# Patient Record
Sex: Female | Born: 1970 | Race: White | Hispanic: No | Marital: Married | Smoking: Current every day smoker
Health system: Southern US, Community
[De-identification: ages and names within clinical notes are randomized; demographics above are authoritative.]

## PROBLEM LIST (undated history)

## (undated) DIAGNOSIS — G709 Myoneural disorder, unspecified: Secondary | ICD-10-CM

## (undated) DIAGNOSIS — C801 Malignant (primary) neoplasm, unspecified: Secondary | ICD-10-CM

## (undated) DIAGNOSIS — S0300XA Dislocation of jaw, unspecified side, initial encounter: Secondary | ICD-10-CM

## (undated) DIAGNOSIS — T7840XA Allergy, unspecified, initial encounter: Secondary | ICD-10-CM

## (undated) DIAGNOSIS — F419 Anxiety disorder, unspecified: Secondary | ICD-10-CM

## (undated) HISTORY — DX: Dislocation of jaw, unspecified side, initial encounter: S03.00XA

## (undated) HISTORY — DX: Malignant (primary) neoplasm, unspecified: C80.1

## (undated) HISTORY — DX: Anxiety disorder, unspecified: F41.9

## (undated) HISTORY — DX: Myoneural disorder, unspecified: G70.9

## (undated) HISTORY — DX: Allergy, unspecified, initial encounter: T78.40XA

---

## 1997-08-23 ENCOUNTER — Other Ambulatory Visit: Admission: RE | Admit: 1997-08-23 | Discharge: 1997-08-23 | Payer: Self-pay | Admitting: Family Medicine

## 2001-09-06 ENCOUNTER — Other Ambulatory Visit: Admission: RE | Admit: 2001-09-06 | Discharge: 2001-09-06 | Payer: Self-pay | Admitting: Obstetrics and Gynecology

## 2019-11-13 ENCOUNTER — Other Ambulatory Visit: Payer: Self-pay

## 2019-11-13 DIAGNOSIS — Z20822 Contact with and (suspected) exposure to covid-19: Secondary | ICD-10-CM

## 2019-11-15 LAB — NOVEL CORONAVIRUS, NAA: SARS-CoV-2, NAA: NOT DETECTED

## 2019-11-15 LAB — SARS-COV-2, NAA 2 DAY TAT

## 2020-04-08 ENCOUNTER — Other Ambulatory Visit: Payer: Self-pay | Admitting: Ophthalmology

## 2020-04-09 ENCOUNTER — Other Ambulatory Visit: Payer: Self-pay | Admitting: Ophthalmology

## 2020-04-09 DIAGNOSIS — G245 Blepharospasm: Secondary | ICD-10-CM

## 2020-05-01 ENCOUNTER — Other Ambulatory Visit: Payer: Self-pay

## 2020-05-01 ENCOUNTER — Ambulatory Visit
Admission: RE | Admit: 2020-05-01 | Discharge: 2020-05-01 | Disposition: A | Discharge status: Home / Self Care | Payer: BC Managed Care – PPO | Source: Ambulatory Visit | Attending: Ophthalmology | Admitting: Ophthalmology

## 2020-05-01 DIAGNOSIS — G245 Blepharospasm: Secondary | ICD-10-CM

## 2020-05-01 IMAGING — MR MR FACE/TRIGEMINAL WO/W CM
7 of 8 series · 34 of 48 positions shown · IV contrast (multihance)
Comparison: Head CT [DATE].

CLINICAL DATA: Blepharospasm.

EXAM:
MRI FACE TRIGEMINAL WITHOUT AND WITH CONTRAST
MRI BRAIN WITHOUT AND WITH CONTRAST
TECHNIQUE: Multiplanar, multiecho pulse sequences of the face and surrounding
structures, including thin slice imaging of the course of the
Trigeminal Nerves, were obtained both before and after
administration of intravenous contrast.
Multiplanar, multiecho pulse sequences of the brain and surrounding
structures were obtained without and with intravenous contrast.
CONTRAST:  18mL MULTIHANCE GADOBENATE DIMEGLUMINE 529 MG/ML IV SOLN

[Series 1: T1 · sagittal · 4.0mm · 0.70mm/px · 3 of 30 slices shown (1 of 3)]
[im 1/30]
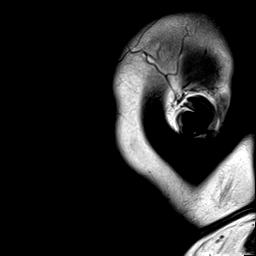
[im 15/30]
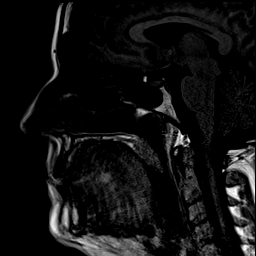
[im 30/30]
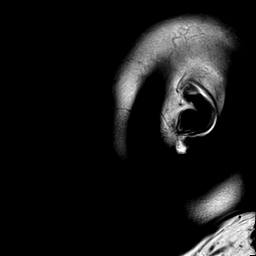

[Series 2: T2 · coronal · 3.0mm · 0.70mm/px · 5 of 43 slices shown]
[im 1/43]
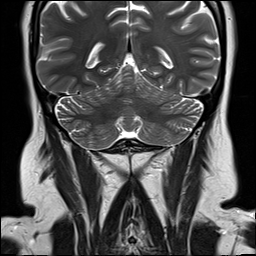
[im 11/43]
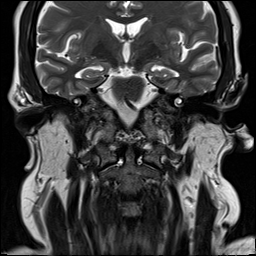
[im 22/43]
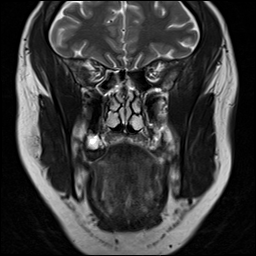
[im 32/43]
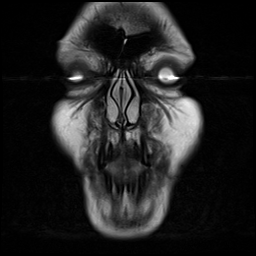
[im 43/43]
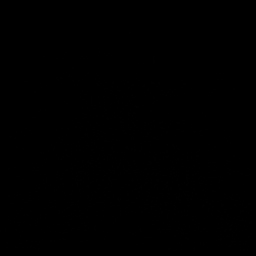

[Series 3: STIR · axial · 3.0mm · 0.66mm/px · z∈[-196,-63]mm · 4 of 35 slices shown]
[im 1/35]
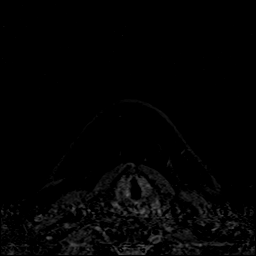
[im 12/35]
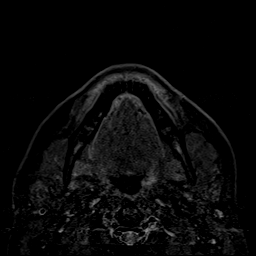
[im 23/35]
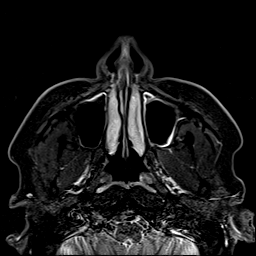
[im 35/35]
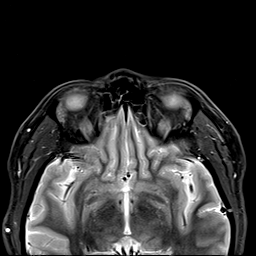

[Series 5: T1 · axial · 3.0mm · 0.33mm/px · z∈[-203,-57]mm · 5 of 45 slices shown (2 of 3)]
[im 1/45]
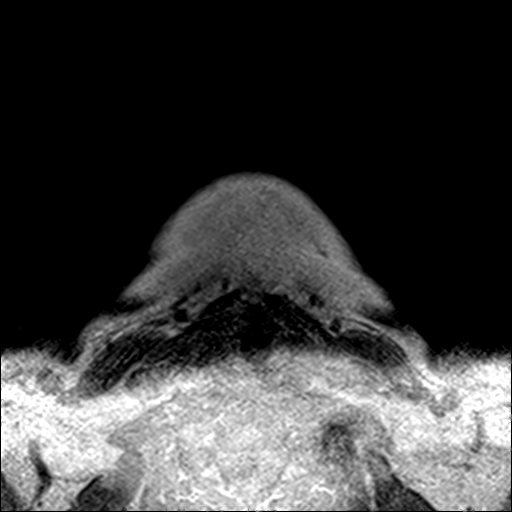
[im 12/45]
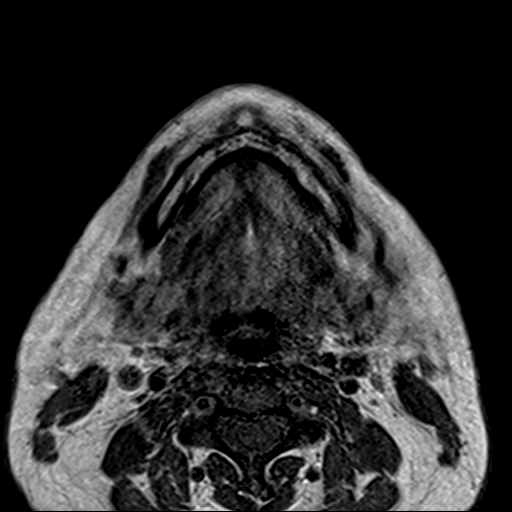
[im 23/45]
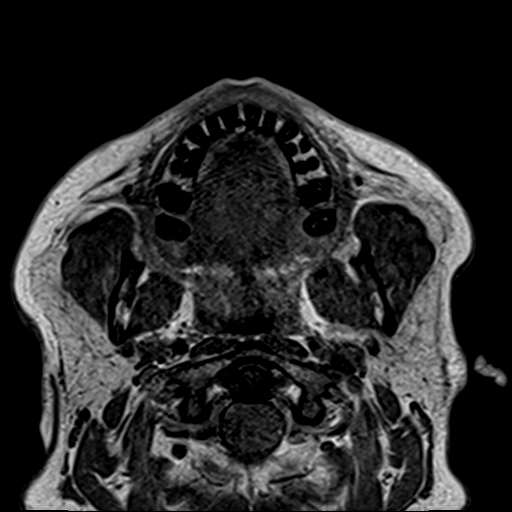
[im 34/45]
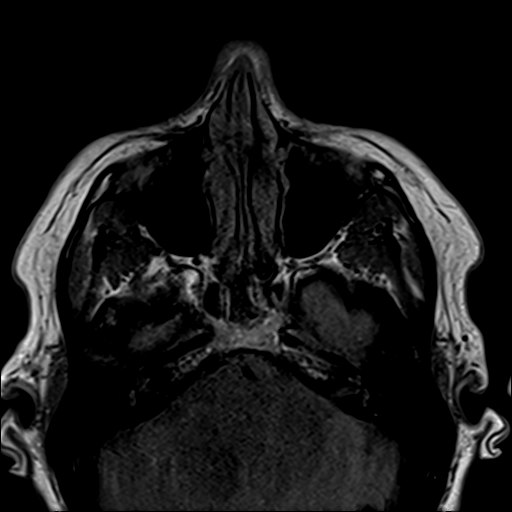
[im 45/45]
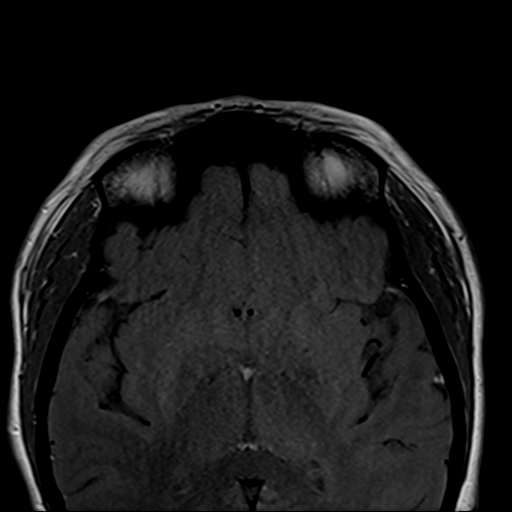

[Series 6: T1 · coronal · 3.0mm · 0.70mm/px · 6 of 50 slices shown (3 of 3)]
[im 1/50]
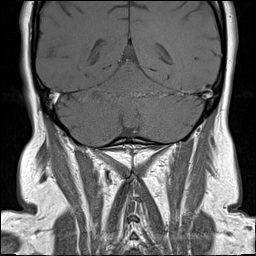
[im 10/50]
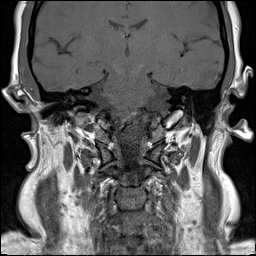
[im 20/50]
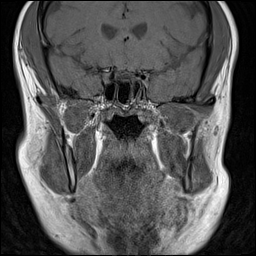
[im 30/50]
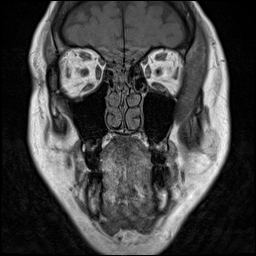
[im 40/50]
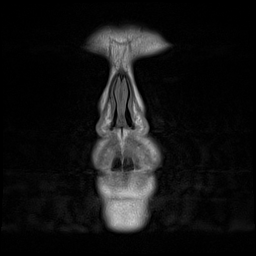
[im 50/50]
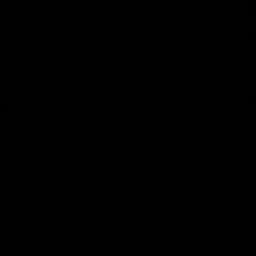

[Series 7: T1 post-contrast · coronal · 3.0mm · 0.70mm/px · 6 of 50 slices shown]
[im 1/50]
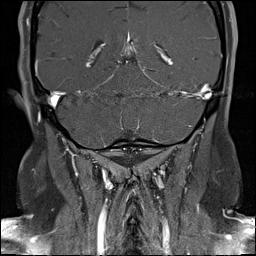
[im 10/50]
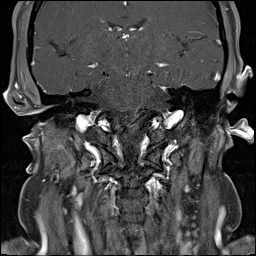
[im 20/50]
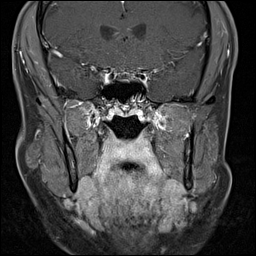
[im 30/50]
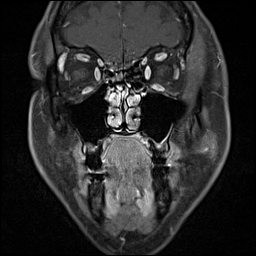
[im 40/50]
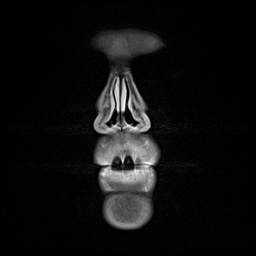
[im 50/50]
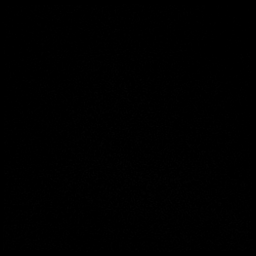

[Series 8: T1 fat-sat post-contrast · axial · 3.0mm · 0.33mm/px · z∈[-203,-57]mm · 5 of 45 slices shown]
[im 1/45]
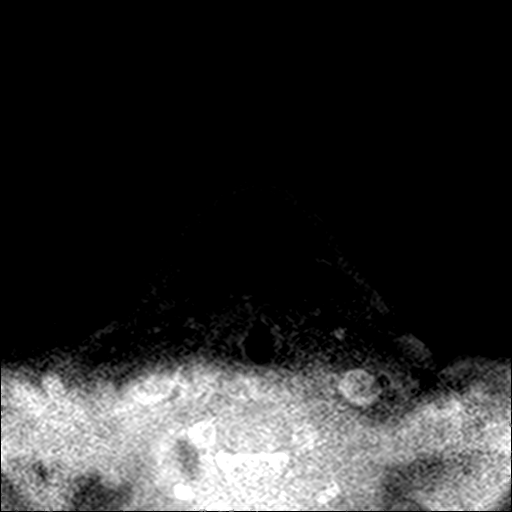
[im 12/45]
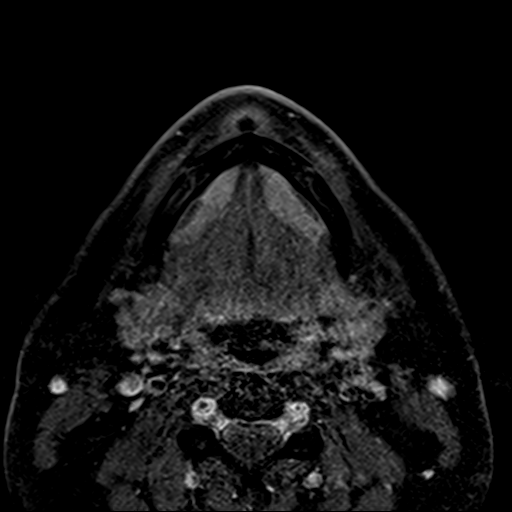
[im 23/45]
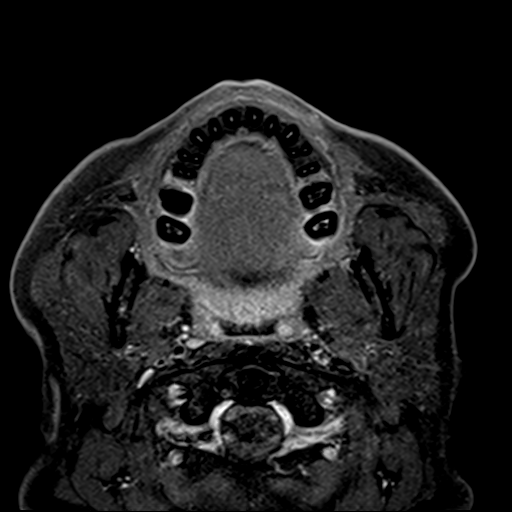
[im 34/45]
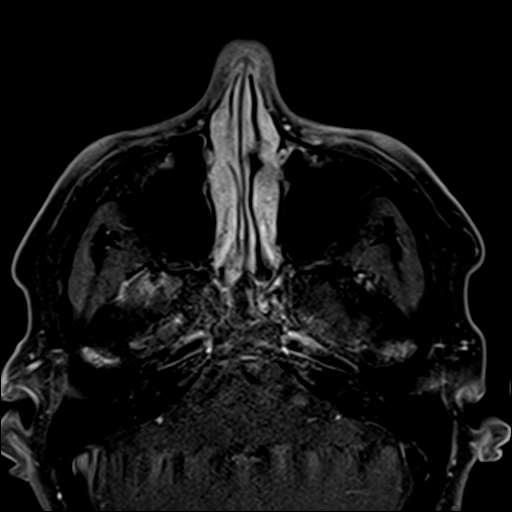
[im 45/45]
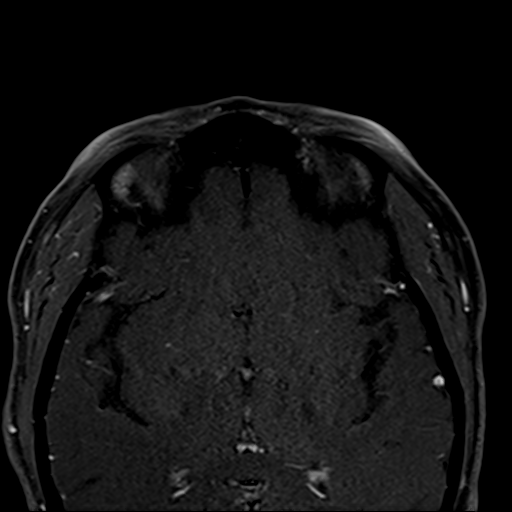

[34 of 48 positions shown; findings below may reference images not displayed]

FINDINGS: The study is partially degraded by motion.

Brain: No acute infarction, hemorrhage, hydrocephalus, extra-axial
collection or mass lesion. The brain parenchyma has normal
morphology and signal characteristics without evidence of abnormal
contrast enhancement.

Vascular: Normal flow voids.

Skull and upper cervical spine: Normal marrow signal.

Sinuses/Orbits: Mild mucosal thickening throughout the paranasal
sinuses. The orbits are maintained.

TRIGEMINAL NERVE

Vascular Loop: A vascular loop abuts the cisternal segment of the
right trigeminal nerve. No vascular loop on the left. No impingement
on the root entry zone bilaterally.

Perineural Enhancement: No abnormal perineural enhancement.

Cavernous Sinus: Normal.

Mandible: Normal.

Muscles of Mastication: Symmetric without denervation atrophy.
IMPRESSION: 1. A vascular loop abuts the cisternal segment of the right
trigeminal nerve. No impingement on the root entry zone bilaterally.
2. Otherwise, unremarkable MRI of the brain face.
3. Mild paranasal sinus disease.

## 2020-05-01 IMAGING — MR MR HEAD WO/W CM
13 series · 48 of 48 positions shown · IV contrast (multihance)
Comparison: Head CT [DATE].

CLINICAL DATA: Blepharospasm.

EXAM:
MRI FACE TRIGEMINAL WITHOUT AND WITH CONTRAST
MRI BRAIN WITHOUT AND WITH CONTRAST
TECHNIQUE: Multiplanar, multiecho pulse sequences of the face and surrounding
structures, including thin slice imaging of the course of the
Trigeminal Nerves, were obtained both before and after
administration of intravenous contrast.
Multiplanar, multiecho pulse sequences of the brain and surrounding
structures were obtained without and with intravenous contrast.
CONTRAST:  18mL MULTIHANCE GADOBENATE DIMEGLUMINE 529 MG/ML IV SOLN

[Series 5: T1 · sagittal · 4.0mm · 0.75mm/px · 2 of 31 slices shown (1 of 3)]
[im 1/31]
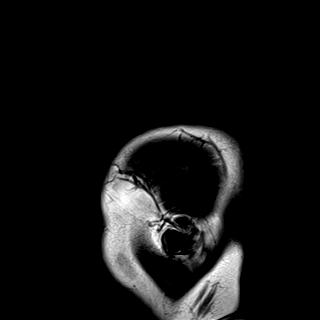
[im 31/31]
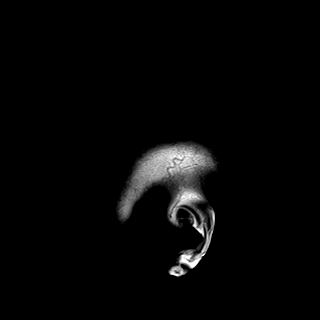

[Series 6: DWI · axial · 3.0mm · 0.94mm/px · z∈[-115,+31]mm · 8 of 168 slices shown (1 of 3)]
[im 1/168]
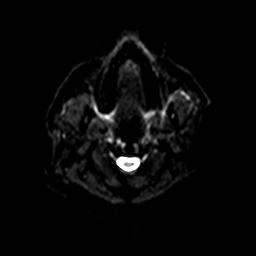
[im 24/168]
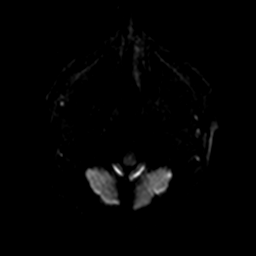
[im 48/168]
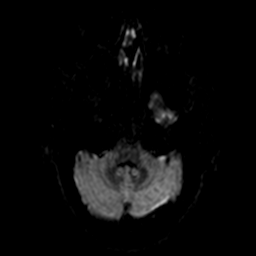
[im 72/168]
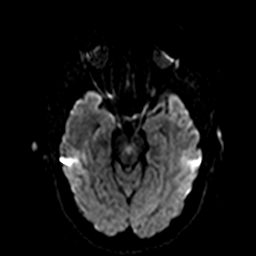
[im 96/168]
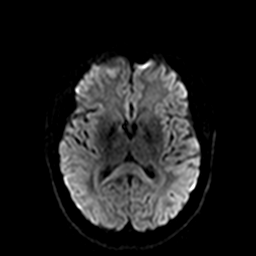
[im 120/168]
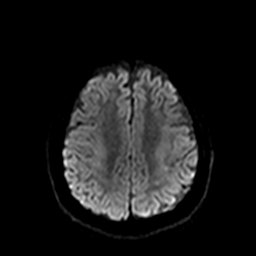
[im 144/168]
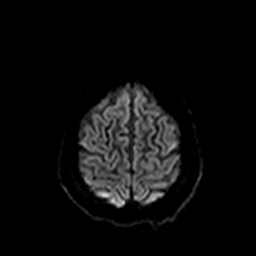
[im 168/168]
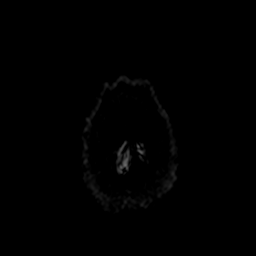

[Series 7: ax dwi_tracew · axial · 3.0mm · 0.94mm/px · z∈[-115,+31]mm · 4 of 84 slices shown]
[im 1/84]
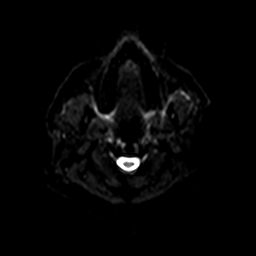
[im 28/84]
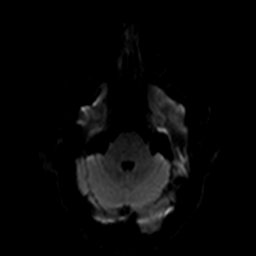
[im 56/84]
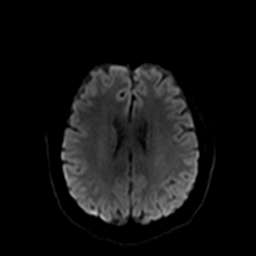
[im 84/84]
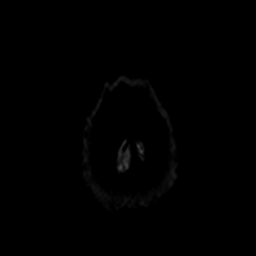

[Series 8: ax dwi_adc · axial · 3.0mm · 0.94mm/px · z∈[-115,+31]mm · 2 of 42 slices shown]
[im 1/42]
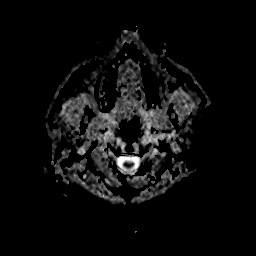
[im 42/42]
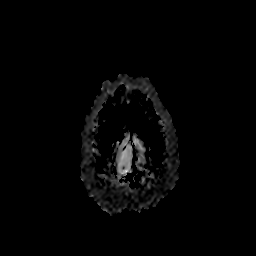

[Series 9: DWI · coronal · 5.0mm · 1.44mm/px · 3 of 60 slices shown (2 of 3)]
[im 1/60]
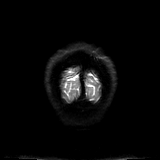
[im 30/60]
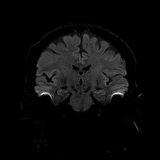
[im 60/60]
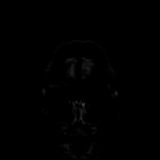

[Series 10: DWI · coronal · 5.0mm · 1.44mm/px · 2 of 30 slices shown (3 of 3)]
[im 1/30]
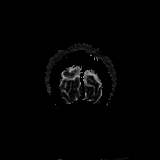
[im 30/30]
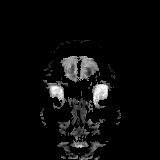

[Series 11: T2 · axial · 4.0mm · 0.36mm/px · 1 of 27 slices shown]
[im 1/27]
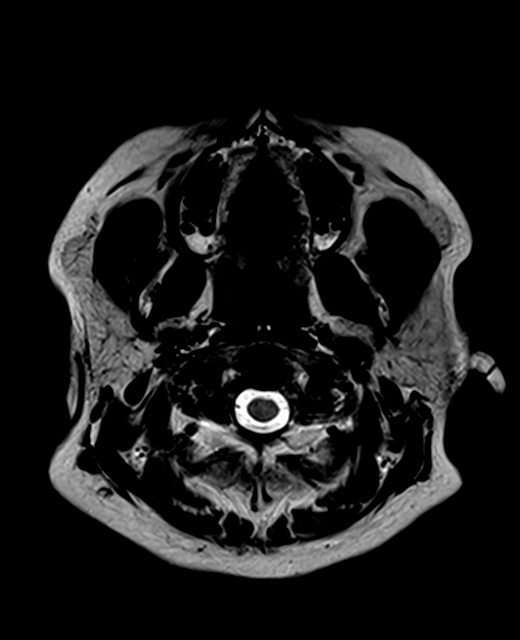

[Series 12: FLAIR · axial · 3.0mm · 0.72mm/px · 1 of 26 slices shown]
[im 1/26]
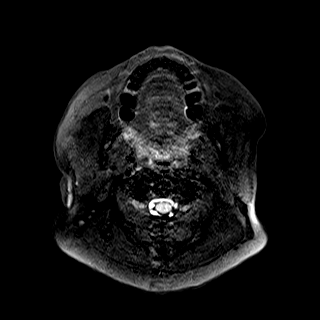

[Series 14: swi_images · axial · 1.5mm · 0.90mm/px · z∈[-113,+28]mm · 5 of 96 slices shown]
[im 1/96]
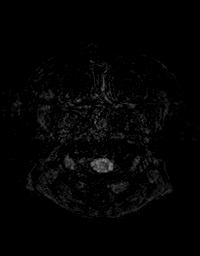
[im 24/96]
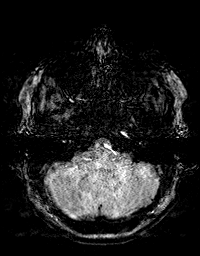
[im 48/96]
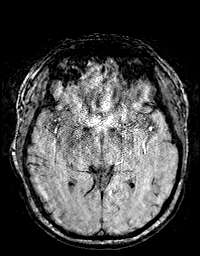
[im 72/96]
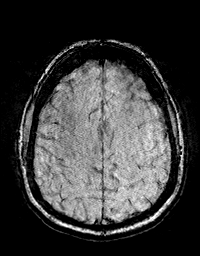
[im 96/96]
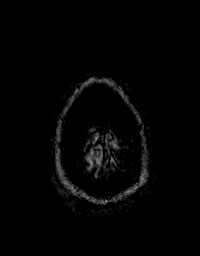

[Series 15: T1 · axial · 1.0mm · 0.94mm/px · z∈[-120,+37]mm · 8 of 160 slices shown (2 of 3)]
[im 1/160]
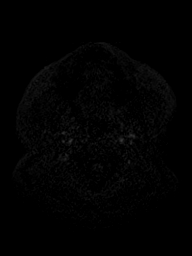
[im 23/160]
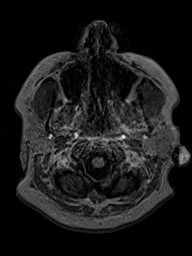
[im 46/160]
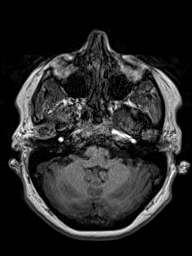
[im 69/160]
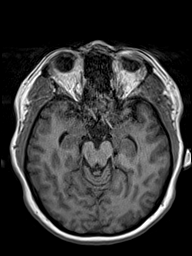
[im 91/160]
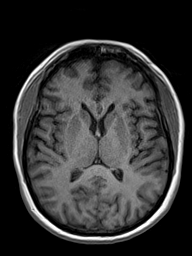
[im 114/160]
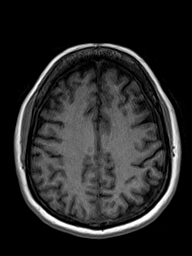
[im 137/160]
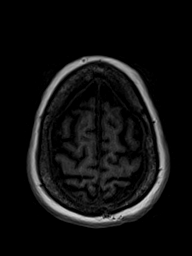
[im 160/160]
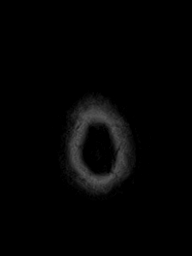

[Series 16: T2 post-contrast · coronal · 4.0mm · 0.36mm/px · 2 of 34 slices shown]
[im 1/34]
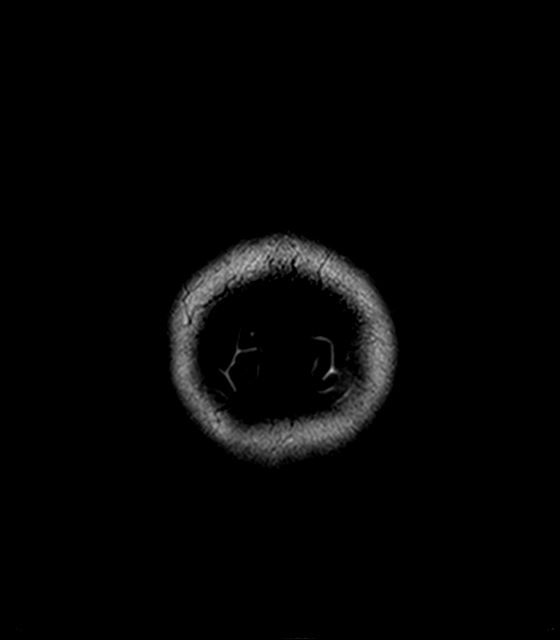
[im 34/34]
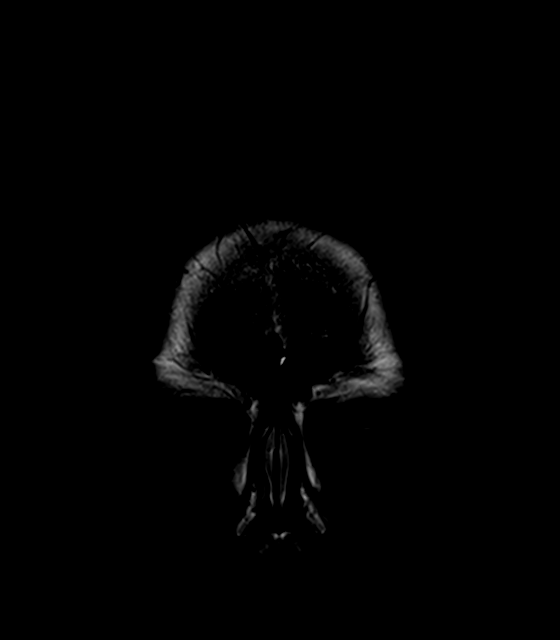

[Series 17: T1 · axial · 1.0mm · 0.94mm/px · z∈[-120,+37]mm · 8 of 160 slices shown (3 of 3)]
[im 1/160]
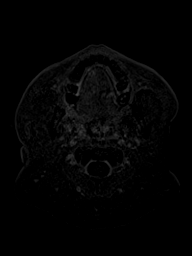
[im 23/160]
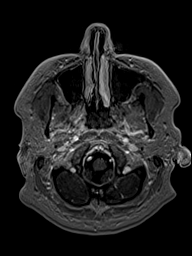
[im 46/160]
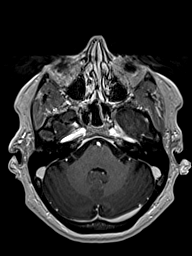
[im 69/160]
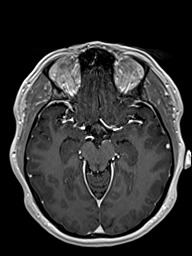
[im 91/160]
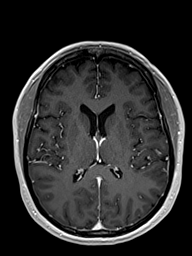
[im 114/160]
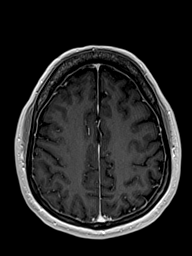
[im 137/160]
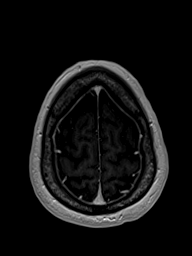
[im 160/160]
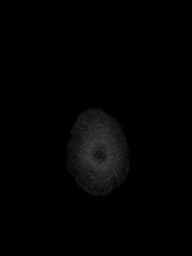

[Series 18: T1 post-contrast · coronal · 4.0mm · 0.72mm/px · 2 of 34 slices shown]
[im 1/34]
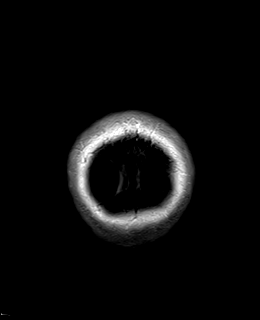
[im 34/34]
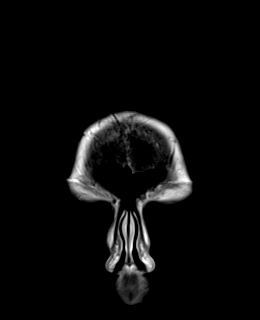

[48 of 48 positions shown; findings below may reference images not displayed]

FINDINGS: The study is partially degraded by motion.

Brain: No acute infarction, hemorrhage, hydrocephalus, extra-axial
collection or mass lesion. The brain parenchyma has normal
morphology and signal characteristics without evidence of abnormal
contrast enhancement.

Vascular: Normal flow voids.

Skull and upper cervical spine: Normal marrow signal.

Sinuses/Orbits: Mild mucosal thickening throughout the paranasal
sinuses. The orbits are maintained.

TRIGEMINAL NERVE

Vascular Loop: A vascular loop abuts the cisternal segment of the
right trigeminal nerve. No vascular loop on the left. No impingement
on the root entry zone bilaterally.

Perineural Enhancement: No abnormal perineural enhancement.

Cavernous Sinus: Normal.

Mandible: Normal.

Muscles of Mastication: Symmetric without denervation atrophy.
IMPRESSION: 1. A vascular loop abuts the cisternal segment of the right
trigeminal nerve. No impingement on the root entry zone bilaterally.
2. Otherwise, unremarkable MRI of the brain face.
3. Mild paranasal sinus disease.

## 2020-05-01 MED ORDER — GADOBENATE DIMEGLUMINE 529 MG/ML IV SOLN
18.0000 mL | Freq: Once | INTRAVENOUS | Status: AC | PRN
  Administered 2020-05-01: 18 mL via INTRAVENOUS

## 2020-11-25 ENCOUNTER — Encounter: Payer: Self-pay | Admitting: Gastroenterology

## 2020-12-08 ENCOUNTER — Other Ambulatory Visit: Payer: Self-pay

## 2020-12-08 ENCOUNTER — Ambulatory Visit (AMBULATORY_SURGERY_CENTER): Payer: BC Managed Care – PPO | Admitting: *Deleted

## 2020-12-08 VITALS — Ht 64.0 in | Wt 194.0 lb

## 2020-12-08 DIAGNOSIS — Z8371 Family history of colonic polyps: Secondary | ICD-10-CM

## 2020-12-08 DIAGNOSIS — Z1211 Encounter for screening for malignant neoplasm of colon: Secondary | ICD-10-CM

## 2020-12-08 DIAGNOSIS — Z83719 Family history of colon polyps, unspecified: Secondary | ICD-10-CM

## 2020-12-08 NOTE — Progress Notes (Signed)
Pt verified name, DOB, address and insurance during PV today.  Pt mailed instruction packet of Emmi video, copy of consent form to read and not return, and instructions. . PV completed over the phone.  Pt encouraged to call with questions or issues.  My Chart instructions to pt as well    No egg or soy allergy known to patient  No issues known to pt with past sedation with any surgeries or procedures Patient denies ever being told they had issues or difficulty with intubation  No FH of Malignant Hyperthermia Pt is not on diet pills Pt is not on  home 02  Pt is not on blood thinners  Pt denies issues with constipation  No A fib or A flutter  Pt is fully vaccinated  for Covid    Due to the COVID-19 pandemic we are asking patients to follow certain guidelines in PV and the [place]   Pt aware of COVID protocols and [facility] guidelines

## 2020-12-11 ENCOUNTER — Encounter: Payer: Self-pay | Admitting: Gastroenterology

## 2021-01-01 ENCOUNTER — Ambulatory Visit (AMBULATORY_SURGERY_CENTER): Payer: BC Managed Care – PPO | Admitting: Gastroenterology

## 2021-01-01 ENCOUNTER — Encounter: Payer: Self-pay | Admitting: Gastroenterology

## 2021-01-01 VITALS — BP 138/81 | HR 65 | Temp 98.7°F | Resp 13 | Ht 64.0 in | Wt 196.0 lb

## 2021-01-01 DIAGNOSIS — D125 Benign neoplasm of sigmoid colon: Secondary | ICD-10-CM

## 2021-01-01 DIAGNOSIS — Z1211 Encounter for screening for malignant neoplasm of colon: Secondary | ICD-10-CM

## 2021-01-01 MED ORDER — SODIUM CHLORIDE 0.9 % IV SOLN: 500.0000 mL | Freq: Once | INTRAVENOUS | Status: AC

## 2021-01-01 NOTE — Progress Notes (Signed)
[facility] History and Physical   Primary Care Physician:  Zoila Shutter, NP   Reason for Procedure:   Family history of colon polyps (mom)  Plan:     colonoscopy     HPI: Margaret Woodward is a 50 y.o. female    Past Medical History:  Diagnosis Date   Allergy    Anxiety    Cancer ([place])    skin cancer   Neuromuscular disorder ([place])    Raynaud's dx   TMJ (dislocation of temporomandibular joint)     History reviewed. No pertinent surgical history.  Prior to Admission medications   Medication Sig Start Date End Date Taking? Authorizing Provider  Ascorbic Acid (VITAMIN C) 1000 MG tablet Take 1,000 mg by mouth daily.   Yes [provider]  B Complex-C (SUPER B COMPLEX PO) Take by mouth.   Yes [provider]  Baclofen 5 MG TABS Take 1 tablet by mouth daily as needed. 09/30/20  Yes [provider]  clonazePAM (KLONOPIN) 0.5 MG tablet Take 0.5 mg by mouth 2 (two) times daily. 09/30/20  Yes [provider]  nicotine polacrilex (NICORETTE) 4 MG gum Take by mouth.   Yes [provider]  Omega-3 Fatty Acids (OMEGA 3 PO) Take by mouth.   Yes [provider]  VITAMIN D PO Take by mouth.   Yes [provider]  VITAMIN E PO Take by mouth.   Yes [provider]  albuterol (VENTOLIN HFA) 108 (90 Base) MCG/ACT inhaler SMARTSIG:1 Puff(s) Via Inhaler Every 4 Hours PRN 08/04/20   [provider]  BOTOX 100 units SOLR injection  12/01/20   [provider]  cyanocobalamin (,VITAMIN B-12,) 1000 MCG/ML injection Inject into the muscle. 08/04/20   [provider]  mupirocin ointment (BACTROBAN) 2 % Apply topically. 06/16/20   [provider]    Current Outpatient Medications  Medication Sig Dispense Refill   Ascorbic Acid (VITAMIN C) 1000 MG tablet Take 1,000 mg by mouth daily.     B Complex-C (SUPER B COMPLEX PO) Take by mouth.     Baclofen 5 MG TABS Take 1 tablet by mouth daily as  needed.     clonazePAM (KLONOPIN) 0.5 MG tablet Take 0.5 mg by mouth 2 (two) times daily.     nicotine polacrilex (NICORETTE) 4 MG gum Take by mouth.     Omega-3 Fatty Acids (OMEGA 3 PO) Take by mouth.     VITAMIN D PO Take by mouth.     VITAMIN E PO Take by mouth.     albuterol (VENTOLIN HFA) 108 (90 Base) MCG/ACT inhaler SMARTSIG:1 Puff(s) Via Inhaler Every 4 Hours PRN     BOTOX 100 units SOLR injection      cyanocobalamin (,VITAMIN B-12,) 1000 MCG/ML injection Inject into the muscle.     mupirocin ointment (BACTROBAN) 2 % Apply topically.     Current Facility-Administered Medications  Medication Dose Route Frequency Provider Last Rate Last Admin   0.9 %  sodium chloride infusion  500 mL Intravenous Once Jackquline Denmark, MD        Allergies as of 01/01/2021 - Review Complete 01/01/2021  Allergen Reaction Noted   Doxycycline Other (See Comments) and Shortness Of Breath 05/04/2015   Amoxicillin-pot clavulanate Nausea And Vomiting 08/04/2017   Phentermine-topiramate Other (See Comments) 05/07/2019    Family History  Problem Relation Age of Onset   Colon polyps Mother    Colon cancer Neg Hx    Esophageal cancer Neg Hx  Rectal cancer Neg Hx    Stomach cancer Neg Hx     Social History   Socioeconomic History   Marital status: Married    Spouse name: Not on file   Number of children: Not on file   Years of education: Not on file   Highest education level: Not on file  Occupational History   Not on file  Tobacco Use   Smoking status: Every Day    Packs/day: 0.50    Types: Cigarettes   Smokeless tobacco: Never   Tobacco comments:    0.5-1.0 a day   Vaping Use   Vaping Use: Never used  Substance and Sexual Activity   Alcohol use: Yes    Comment: OCC   Drug use: Never   Sexual activity: Not on file  Other Topics Concern   Not on file  Social History Narrative   Not on file   Social Determinants of Health   Financial Resource Strain: Not on file  Food Insecurity:  Not on file  Transportation Needs: Not on file  Physical Activity: Not on file  Stress: Not on file  Social Connections: Not on file  Intimate Partner Violence: Not on file    Review of Systems: Positive for none All other review of systems negative except as mentioned in the HPI.  Physical Exam: Vital signs in last 24 hours: @VSRANGES @   General:   Alert,  Well-developed, well-nourished, pleasant and cooperative in NAD Lungs:  Clear throughout to auscultation.   Heart:  Regular rate and rhythm; no murmurs, clicks, rubs,  or gallops. Abdomen:  Soft, nontender and nondistended. Normal bowel sounds.   Neuro/Psych:  Alert and cooperative. Normal mood and affect. A and O x 3    No significant changes were identified.  The patient continues to be an appropriate candidate for the planned procedure and anesthesia.   Carmell Austria, MD. [facility] 01/01/2021 9:29 AM@

## 2021-01-01 NOTE — Patient Instructions (Signed)
YOU HAD AN ENDOSCOPIC PROCEDURE TODAY AT THE [facility]:   Refer to the procedure report that was given to you for any specific questions about what was found during the examination.  If the procedure report does not answer your questions, please call your gastroenterologist to clarify.  If you requested that your care partner not be given the details of your procedure findings, then the procedure report has been included in a sealed envelope for you to review at your convenience later.  YOU SHOULD EXPECT: Some feelings of bloating in the abdomen. Passage of more gas than usual.  Walking can help get rid of the air that was put into your GI tract during the procedure and reduce the bloating. If you had a lower endoscopy (such as a colonoscopy or flexible sigmoidoscopy) you may notice spotting of blood in your stool or on the toilet paper. If you underwent a bowel prep for your procedure, you may not have a normal bowel movement for a few days.  Please Note:  You might notice some irritation and congestion in your nose or some drainage.  This is from the oxygen used during your procedure.  There is no need for concern and it should clear up in a day or so.  SYMPTOMS TO REPORT IMMEDIATELY:  Following lower endoscopy (colonoscopy or flexible sigmoidoscopy):  Excessive amounts of blood in the stool  Significant tenderness or worsening of abdominal pains  Swelling of the abdomen that is new, acute  Fever of 100F or higher   For urgent or emergent issues, a gastroenterologist can be reached at any hour by calling [phone]. Do not use MyChart messaging for urgent concerns.    DIET:  We do recommend a small meal at first, but then you may proceed to your regular diet.  Drink plenty of fluids but you should avoid alcoholic beverages for 24 hours.  MEDICATIONS:  Continue present medications.  Please see handouts given to you by your recovery nurse.  Thank you for allowing us to  provide for your healthcare needs today.  ACTIVITY:  You should plan to take it easy for the rest of today and you should NOT DRIVE or use heavy machinery until tomorrow (because of the sedation medicines used during the test).    FOLLOW UP: Our staff will call the number listed on your records 48-72 hours following your procedure to check on you and address any questions or concerns that you may have regarding the information given to you following your procedure. If we do not reach you, we will leave a message.  We will attempt to reach you two times.  During this call, we will ask if you have developed any symptoms of COVID 19. If you develop any symptoms (ie: fever, flu-like symptoms, shortness of breath, cough etc.) before then, please call [phone].  If you test positive for Covid 19 in the 2 weeks post procedure, please call and report this information to us.    If any biopsies were taken you will be contacted by phone or by letter within the next 1-3 weeks.  Please call us at [phone] if you have not heard about the biopsies in 3 weeks.    SIGNATURES/CONFIDENTIALITY: You and/or your care partner have signed paperwork which will be entered into your electronic medical record.  These signatures attest to the fact that that the information above on your After Visit Summary has been reviewed and is understood.  Full responsibility of the   confidentiality of this discharge information lies with you and/or your care-partner.  

## 2021-01-01 NOTE — Progress Notes (Signed)
Called to room to assist during endoscopic procedure.  Patient ID and intended procedure confirmed with present staff. Received instructions for my participation in the procedure from the performing physician.  

## 2021-01-01 NOTE — Progress Notes (Signed)
PT taken to PACU. Monitors in place. VSS. Report given to RN. 

## 2021-01-01 NOTE — Op Note (Signed)
[facility] Patient Name: Margaret Woodward Procedure Date: 01/01/2021 9:29 AM MRN: [id number] Endoscopist: Jackquline Denmark , MD Age: 50 Referring MD:  Date of Birth: 26-Aug-1970 Gender: Female Account #: [id number] Procedure:                Colonoscopy Indications:              Screening for colorectal malignant neoplasm, Colon                            cancer screening in patient at increased risk:                            Family history (mom) with colon polyps Medicines:                Monitored Anesthesia Care Procedure:                Pre-Anesthesia Assessment:                           - Prior to the procedure, a History and Physical                            was performed, and patient medications and                            allergies were reviewed. The patient's tolerance of                            previous anesthesia was also reviewed. The risks                            and benefits of the procedure and the sedation                            options and risks were discussed with the patient.                            All questions were answered, and informed consent                            was obtained. Prior Anticoagulants: The patient has                            taken no previous anticoagulant or antiplatelet                            agents. ASA Grade Assessment: II - A patient with                            mild systemic disease. After reviewing the risks                            and benefits, the patient was deemed in  satisfactory condition to undergo the procedure.                           After obtaining informed consent, the colonoscope                            was passed under direct vision. Throughout the                            procedure, the patient's blood pressure, pulse, and                            oxygen saturations were monitored continuously. The                            PCF-HQ190L Colonoscope was  introduced through the                            anus and advanced to the 2 cm into the ileum. The                            colonoscopy was performed without difficulty. The                            patient tolerated the procedure well. The quality                            of the bowel preparation was good. The terminal                            ileum, ileocecal valve, appendiceal orifice, and                            rectum were photographed. Scope In: 9:37:59 AM Scope Out: 9:49:48 AM Scope Withdrawal Time: 0 hours 8 minutes 5 seconds  Total Procedure Duration: 0 hours 11 minutes 49 seconds  Findings:                 Two sessile polyps were found in the mid sigmoid                            colon. The polyps were 4 to 6 mm in size. These                            polyps were removed with a cold snare. Resection                            and retrieval were complete.                           A few small-mouthed diverticula were found in the                            sigmoid colon.  Non-bleeding internal hemorrhoids were found during                            retroflexion. The hemorrhoids were small and Grade                            I (internal hemorrhoids that do not prolapse).                           The terminal ileum appeared normal.                           The exam was otherwise without abnormality on                            direct and retroflexion views. Complications:            No immediate complications. Estimated Blood Loss:     Estimated blood loss: none. Impression:               - Two 4 to 6 mm polyps in the mid sigmoid colon,                            removed with a cold snare. Resected and retrieved.                           - Mild sigmoid diverticulosis.                           - Non-bleeding internal hemorrhoids.                           - The examined portion of the ileum was normal.                           - The  examination was otherwise normal on direct                            and retroflexion views. Recommendation:           - Patient has a contact number available for                            emergencies. The signs and symptoms of potential                            delayed complications were discussed with the                            patient. Return to normal activities tomorrow.                            Written discharge instructions were provided to the                            patient.                           -  Resume previous diet.                           - Continue present medications.                           - Await pathology results.                           - Repeat colonoscopy for surveillance based on                            pathology results. Likely in 5 years d/t strong                            family history of polyps                           - The findings and recommendations were discussed                            with the patient's family. Jackquline Denmark, MD 01/01/2021 9:54:13 AM This report has been signed electronically.

## 2021-01-01 NOTE — Progress Notes (Signed)
Pt's states no medical or surgical changes since previsit or office visit.   VS taken by CW 

## 2021-01-05 ENCOUNTER — Telehealth: Payer: Self-pay

## 2021-01-05 NOTE — Telephone Encounter (Signed)
Attempted to reach patient for post-procedure f/u call. No answer. Left message that staff will make another attempt to reach her later today and for her to please not hesitate to call us if she has any questions/concerns regarding her care.

## 2021-01-13 ENCOUNTER — Telehealth: Payer: Self-pay | Admitting: Gastroenterology

## 2021-01-13 ENCOUNTER — Other Ambulatory Visit (INDEPENDENT_AMBULATORY_CARE_PROVIDER_SITE_OTHER): Payer: BC Managed Care – PPO

## 2021-01-13 ENCOUNTER — Other Ambulatory Visit: Payer: Self-pay

## 2021-01-13 ENCOUNTER — Encounter: Payer: Self-pay | Admitting: Gastroenterology

## 2021-01-13 DIAGNOSIS — R109 Unspecified abdominal pain: Secondary | ICD-10-CM

## 2021-01-13 LAB — CBC WITH DIFFERENTIAL/PLATELET
Basophils Absolute: 0 10*3/uL (ref 0.0–0.1)
Basophils Relative: 0.6 % (ref 0.0–3.0)
Eosinophils Absolute: 0.1 10*3/uL (ref 0.0–0.7)
Eosinophils Relative: 0.8 % (ref 0.0–5.0)
HCT: 41.7 % (ref 36.0–46.0)
Hemoglobin: 14 g/dL (ref 12.0–15.0)
Lymphocytes Relative: 30.9 % (ref 12.0–46.0)
Lymphs Abs: 2.1 10*3/uL (ref 0.7–4.0)
MCHC: 33.6 g/dL (ref 30.0–36.0)
MCV: 97.9 fl (ref 78.0–100.0)
Monocytes Absolute: 0.5 10*3/uL (ref 0.1–1.0)
Monocytes Relative: 8 % (ref 3.0–12.0)
Neutro Abs: 4.1 10*3/uL (ref 1.4–7.7)
Neutrophils Relative %: 59.7 % (ref 43.0–77.0)
Platelets: 194 10*3/uL (ref 150.0–400.0)
RBC: 4.26 Mil/uL (ref 3.87–5.11)
RDW: 12.3 % (ref 11.5–15.5)
WBC: 6.8 10*3/uL (ref 4.0–10.5)

## 2021-01-13 LAB — COMPREHENSIVE METABOLIC PANEL
ALT: 20 U/L (ref 0–35)
AST: 15 U/L (ref 0–37)
Albumin: 4.4 g/dL (ref 3.5–5.2)
Alkaline Phosphatase: 69 U/L (ref 39–117)
BUN: 13 mg/dL (ref 6–23)
CO2: 27 mEq/L (ref 19–32)
Calcium: 9.1 mg/dL (ref 8.4–10.5)
Chloride: 102 mEq/L (ref 96–112)
Creatinine, Ser: 0.76 mg/dL (ref 0.40–1.20)
GFR: 92.24 mL/min (ref >60.00)
Glucose, Bld: 97 mg/dL (ref 70–99)
Potassium: 3.7 mEq/L (ref 3.5–5.1)
Sodium: 137 mEq/L (ref 135–145)
Total Bilirubin: 0.4 mg/dL (ref 0.2–1.2)
Total Protein: 7 g/dL (ref 6.0–8.3)

## 2021-01-13 LAB — C-REACTIVE PROTEIN: CRP: 1.5 mg/dL (ref 0.5–20.0)

## 2021-01-13 LAB — LIPASE: Lipase: 45 U/L (ref 11.0–59.0)

## 2021-01-13 NOTE — Telephone Encounter (Signed)
Inbound call from patient. States she was fine afyer procedure 11/3 but on Sunday 11/13 patient begin having abd pain. Have been tender and feels as if it is inflamed. Best contact number [phone]

## 2021-01-13 NOTE — Telephone Encounter (Signed)
Pt notified of Dr. Lyndel Safe Recommendations: Labs for Blood work entered into [employer]. Pt made aware. Location give to Lab  CT scan ordered and scheduled for 01/19/2021 @ 5:30PM pt to arrive at 5:15 PM.  Pt to be NPO 4 hours prior Pick up contrast before the 21st Pt to drink first bottle at 3:30 Drink second bottle at 4:30  Pt made aware of all detail of CT Scan Pt verbalized understanding with all questions answered.

## 2021-01-13 NOTE — Telephone Encounter (Signed)
Pt is a nurse at [employer] states that she had her  colonoscopy on Nov 3rd.  States that she had felt fine until NOV 13 Sunday evening when she had ate some [place].  After eating pt states that she had a sudden onset of severe abdomen pain, sweating, turning pale.  Pt states that she feels that her abdomen has been distended since then, tender to touch. Pt states that she is having BM, No Nausea, No Diarrhea, No Chills Pt states that she feels her abdomen is inflamed.  Please advise

## 2021-01-13 NOTE — Telephone Encounter (Signed)
Left Message for pt to call back  °

## 2021-01-13 NOTE — Telephone Encounter (Signed)
Not related to the procedure. Likely allergic reaction  Plan: -Get CBC, CMP, CRP, lipase -CT AP with Po and IV contrast RE: Abdo pain  RG

## 2021-01-19 ENCOUNTER — Other Ambulatory Visit: Payer: Self-pay

## 2021-01-19 ENCOUNTER — Ambulatory Visit (HOSPITAL_COMMUNITY)
Admission: RE | Admit: 2021-01-19 | Discharge: 2021-01-19 | Disposition: A | Discharge status: Home / Self Care | Payer: BC Managed Care – PPO | Source: Ambulatory Visit | Attending: Gastroenterology | Admitting: Gastroenterology

## 2021-01-19 DIAGNOSIS — R109 Unspecified abdominal pain: Secondary | ICD-10-CM | POA: Diagnosis not present

## 2021-01-19 IMAGING — CT CT ABD-PELV W/ CM
2 of 6 series · 15 of 46 positions shown, 17 images · IV contrast (APPLIED)
Comparison: None.

CLINICAL DATA: Abdominal pain.

EXAM:
CT ABDOMEN AND PELVIS WITH CONTRAST
TECHNIQUE: Multidetector CT imaging of the abdomen and pelvis was performed
using the standard protocol following bolus administration of
intravenous contrast.
CONTRAST:  80mL OMNIPAQUE IOHEXOL 350 MG/ML SOLN

[Series 2: axial st · axial · 0.92mm/px · z∈[-642,-262]mm · 12 of 90 slices shown, 14 images]
[im 7/90  soft-tissue]
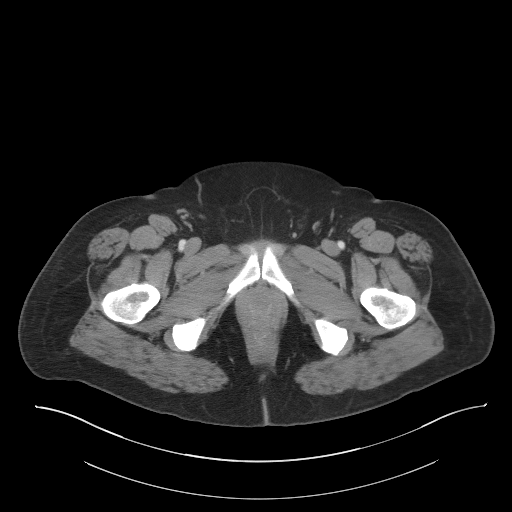
[im 7/90  bone]
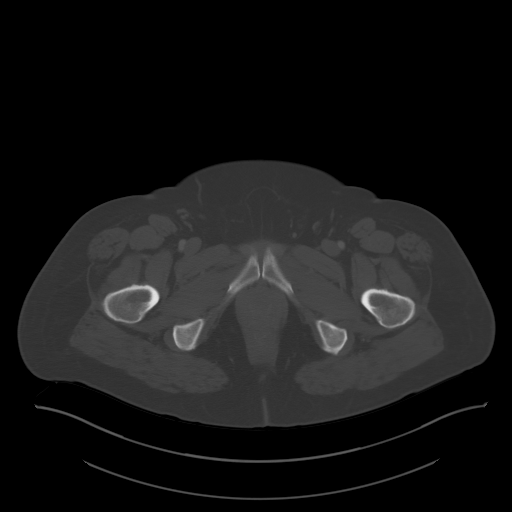
[im 14/90  soft-tissue]
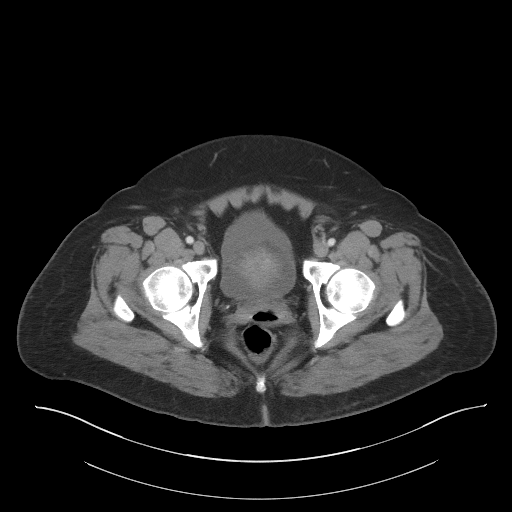
[im 21/90  soft-tissue]
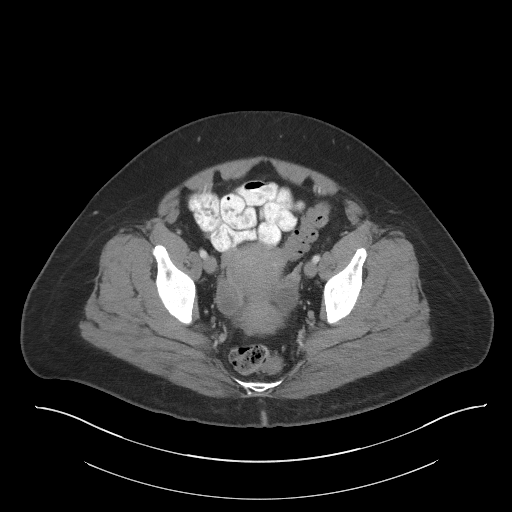
[im 28/90  soft-tissue]
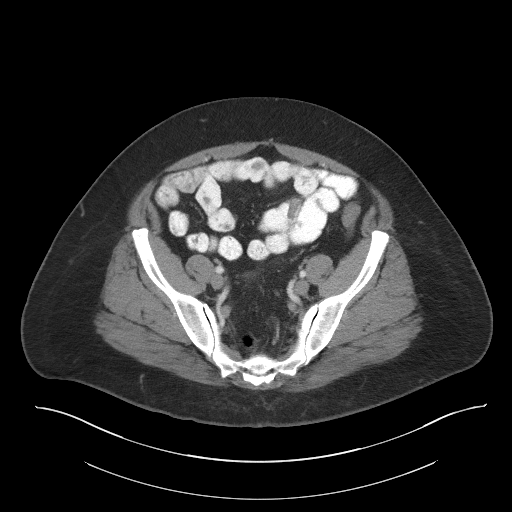
[im 35/90  soft-tissue]
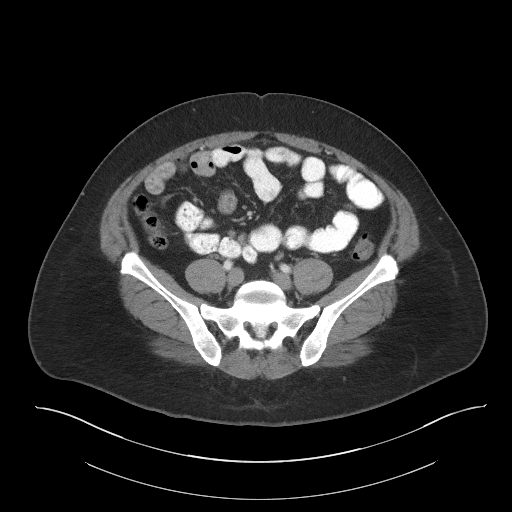
[im 42/90  soft-tissue]
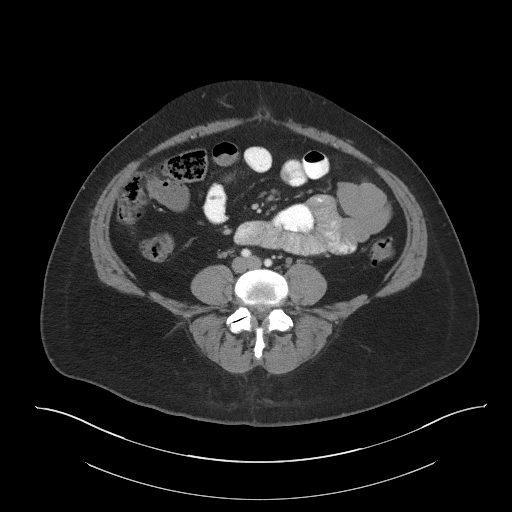
[im 48/90  soft-tissue]
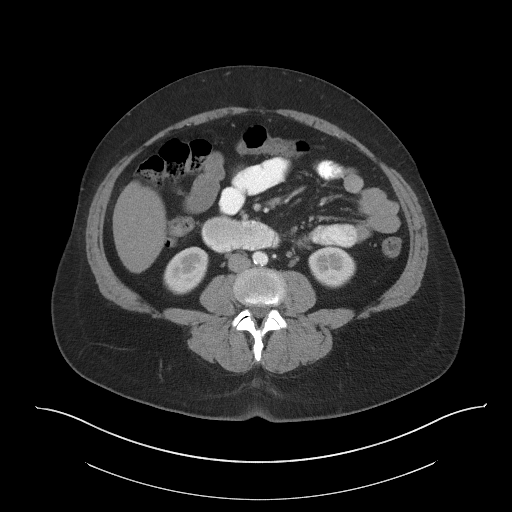
[im 55/90  soft-tissue]
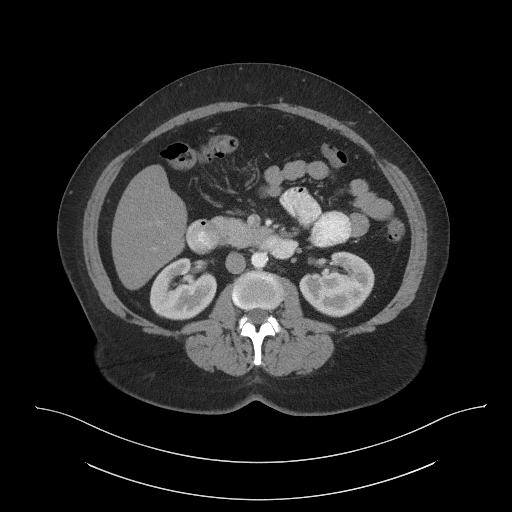
[im 62/90  soft-tissue]
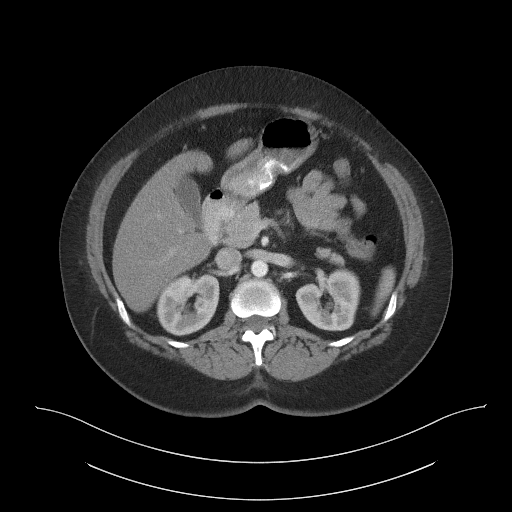
[im 62/90  bone]
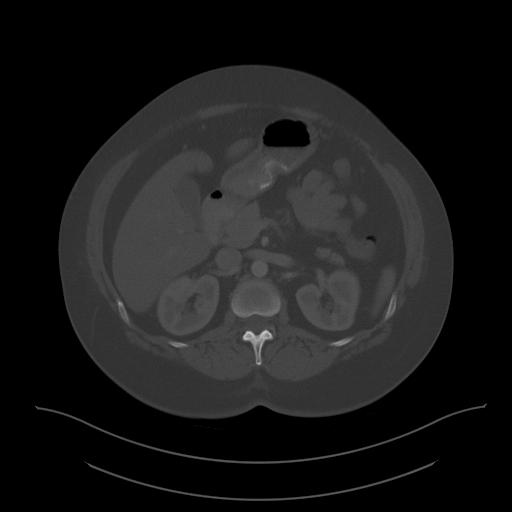
[im 69/90  soft-tissue]
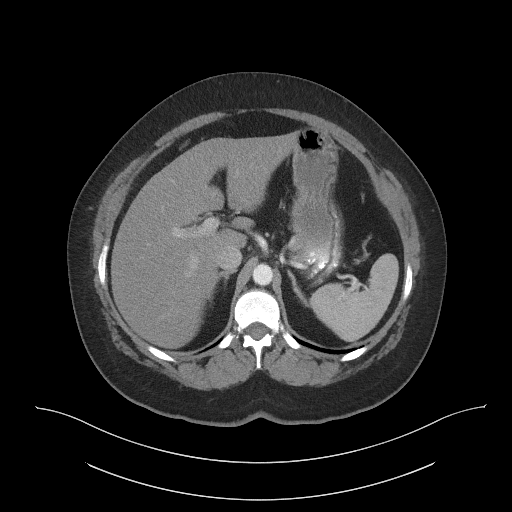
[im 76/90  soft-tissue]
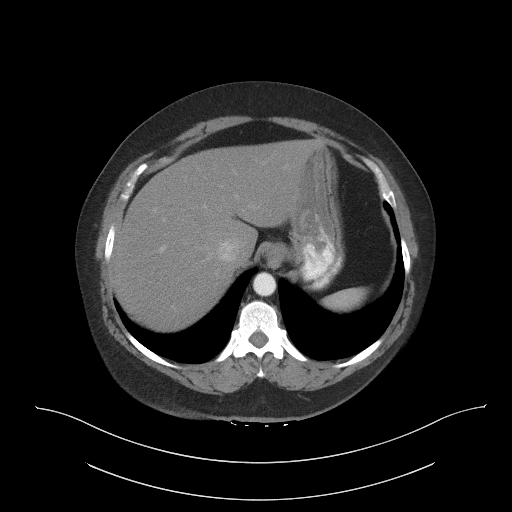
[im 83/90  soft-tissue]
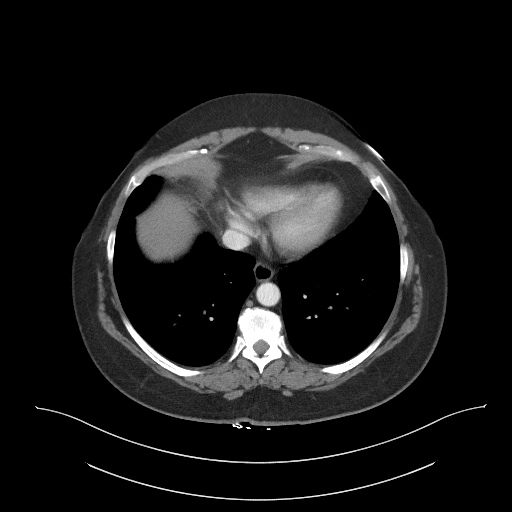

[Series 7: coronal st · coronal · 0.68mm/px · 3 of 101 slices shown]
[im 34/101  soft-tissue]
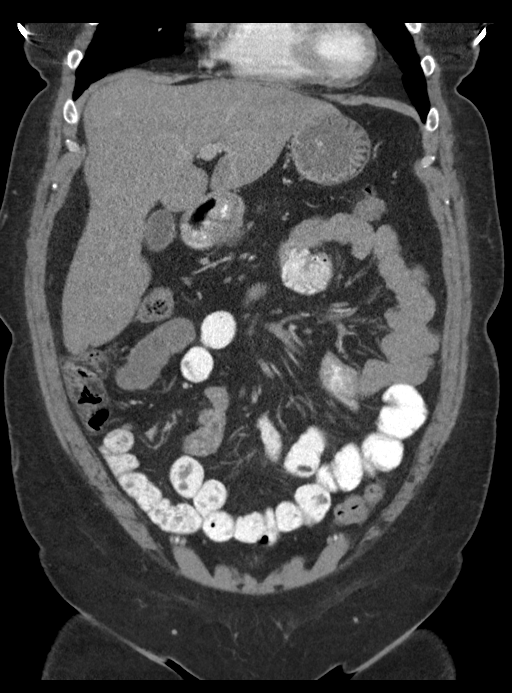
[im 45/101  soft-tissue]
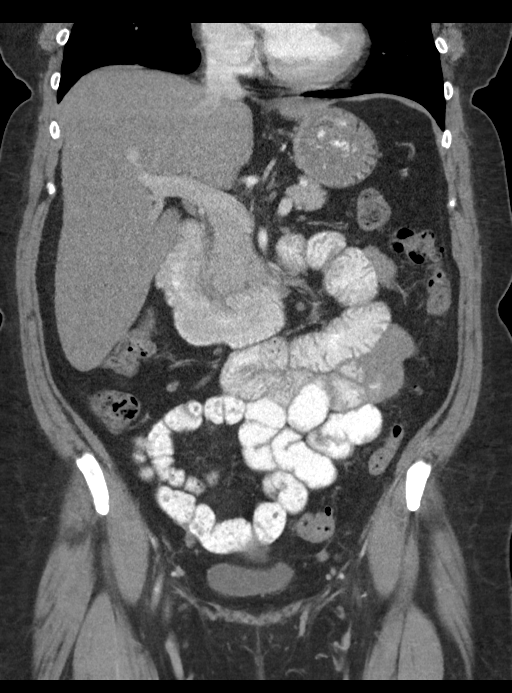
[im 56/101  soft-tissue]
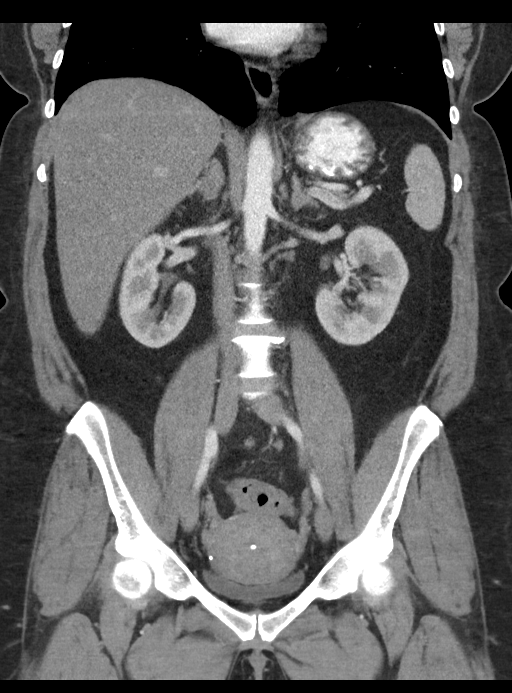

[15 of 46 positions shown; findings below may reference images not displayed]

FINDINGS: Lower Chest: No acute findings.

Hepatobiliary: No hepatic masses identified. Mild-to-moderate
diffuse hepatic steatosis is demonstrated. Gallbladder is
unremarkable. No evidence of biliary ductal dilatation.

Pancreas:  No mass or inflammatory changes.

Spleen: Within normal limits in size and appearance.

Adrenals/Urinary Tract: No masses identified. No evidence of
ureteral calculi or hydronephrosis.

Stomach/Bowel: No evidence of obstruction, inflammatory process or
abnormal fluid collections. Normal appendix visualized.

Vascular/Lymphatic: No pathologically enlarged lymph nodes. No acute
vascular findings. Aortic atherosclerotic calcification noted.

Reproductive: IUD seen in expected position. No mass or other
significant abnormality.

Other:  None.

Musculoskeletal:  No suspicious bone lesions identified.
IMPRESSION: No acute findings within the abdomen or pelvis.

Hepatic steatosis.

IUD in expected position.

Aortic Atherosclerosis ([MY]-[MY]).

## 2021-01-19 MED ORDER — IOHEXOL 350 MG/ML SOLN
80.0000 mL | Freq: Once | INTRAVENOUS | Status: AC | PRN
  Administered 2021-01-19: 80 mL via INTRAVENOUS

## 2021-04-27 NOTE — Progress Notes (Signed)
Initial phone contact with newly diagnosed breast cancer pt. Per pt request , appt was scheduled with Dr. Noberto Retort for 05/04/21 at 8:30. Directions given to pt to Dr. Gabriel Carina. Encouraged pt to stop by [place] and pick up "The Breast Cancer Treatment Handbook". Instructed pt to call with questions or concerns. Records faxed to Dr. Noberto Retort,

## 2021-05-06 ENCOUNTER — Telehealth: Payer: Self-pay | Admitting: Hematology and Oncology

## 2021-05-06 NOTE — Telephone Encounter (Signed)
Scheduled appt per 3/7 referral. Pt is aware of appt date and time. Pt is aware to arrive 15 mins prior to appt time and to bring and updated insurance card. Pt is aware of appt location.   

## 2021-05-07 ENCOUNTER — Telehealth: Payer: Self-pay | Admitting: Genetic Counselor

## 2021-05-07 NOTE — Telephone Encounter (Signed)
Scheduled genetics appt per 3/9 staff msg from [facility] in [place]. Pt is aware of appt date and time. Pt is aware to arrive 15 mins prior to appt time and to bring and updated insurance card. Pt is aware of appt location.   ?

## 2021-05-14 ENCOUNTER — Other Ambulatory Visit: Payer: BC Managed Care – PPO

## 2021-05-19 ENCOUNTER — Ambulatory Visit: Payer: BC Managed Care – PPO | Admitting: Hematology and Oncology

## 2021-05-19 ENCOUNTER — Other Ambulatory Visit: Payer: BC Managed Care – PPO

## 2021-05-20 NOTE — Progress Notes (Signed)
Closed to navigation. Patient is now receiving her surgical and oncology treatment in [facility]. ?

## 2021-12-18 ENCOUNTER — Ambulatory Visit (HOSPITAL_COMMUNITY)
Admission: RE | Admit: 2021-12-18 | Discharge: 2021-12-18 | Disposition: A | Discharge status: Home / Self Care | Payer: BC Managed Care – PPO | Source: Ambulatory Visit | Attending: Internal Medicine | Admitting: Internal Medicine

## 2021-12-18 VITALS — BP 150/92 | HR 88 | Wt 201.0 lb

## 2021-12-18 DIAGNOSIS — Z9221 Personal history of antineoplastic chemotherapy: Secondary | ICD-10-CM | POA: Diagnosis not present

## 2021-12-18 DIAGNOSIS — I10 Essential (primary) hypertension: Secondary | ICD-10-CM

## 2021-12-18 DIAGNOSIS — R0683 Snoring: Secondary | ICD-10-CM | POA: Diagnosis not present

## 2021-12-18 DIAGNOSIS — Z09 Encounter for follow-up examination after completed treatment for conditions other than malignant neoplasm: Secondary | ICD-10-CM | POA: Diagnosis not present

## 2021-12-18 DIAGNOSIS — Z720 Tobacco use: Secondary | ICD-10-CM

## 2021-12-18 DIAGNOSIS — C50911 Malignant neoplasm of unspecified site of right female breast: Secondary | ICD-10-CM | POA: Diagnosis not present

## 2021-12-18 DIAGNOSIS — Z8249 Family history of ischemic heart disease and other diseases of the circulatory system: Secondary | ICD-10-CM | POA: Insufficient documentation

## 2021-12-18 DIAGNOSIS — R9431 Abnormal electrocardiogram [ECG] [EKG]: Secondary | ICD-10-CM | POA: Insufficient documentation

## 2021-12-18 DIAGNOSIS — Z79899 Other long term (current) drug therapy: Secondary | ICD-10-CM | POA: Insufficient documentation

## 2021-12-18 DIAGNOSIS — F17210 Nicotine dependence, cigarettes, uncomplicated: Secondary | ICD-10-CM | POA: Diagnosis not present

## 2021-12-18 DIAGNOSIS — Z170 Estrogen receptor positive status [ER+]: Secondary | ICD-10-CM | POA: Diagnosis not present

## 2021-12-18 DIAGNOSIS — E669 Obesity, unspecified: Secondary | ICD-10-CM | POA: Insufficient documentation

## 2021-12-18 NOTE — Progress Notes (Signed)
CARDIO-ONCOLOGY CLINIC CONSULT NOTE  Referring Physician: Dr. 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 Humphrey Rolls for enrollment into the Cardio-Oncology program.   Denies h/o cardiac problems. Had stress echo 20 years for CP. Which was normal. Feels ok. Fatigued from chemo. Not exercising. No current DOE. No CP. + snoring. Mild LE edema with chemo but now normal.   - Diagnosed with right breast CA (triple positive).  - s/p lumpectomy 4/23 margins clear. LNs negative - Adjuvant chemo taxotere/cisplat/Herceptin  07/10/21 - 10/28/21 - Single agent Herceptin on 11/18/21, 12/09/21  Echo 06/23/21 EF 60-65% GLS -18.5 Echo 09/14/21 EF 60-65% GLS -19.4 Trace TR Echo 12/07/21 EF 60-65% GLS -15.1% Mild TR 64mHG    Review of Systems: [y] = yes, '[ ]'$  = no   General: Weight gain '[ ]'$ ; Weight loss '[ ]'$ ; Anorexia '[ ]'$ ; Fatigue '[ ]'$ ; Fever '[ ]'$ ; Chills '[ ]'$ ; Weakness '[ ]'$   Cardiac: Chest pain/pressure '[ ]'$ ; Resting SOB '[ ]'$ ; Exertional SOB '[ ]'$ ; Orthopnea '[ ]'$ ; Pedal Edema '[ ]'$ ; Palpitations '[ ]'$ ; Syncope '[ ]'$ ; Presyncope '[ ]'$ ; Paroxysmal nocturnal dyspnea'[ ]'$   Pulmonary: Cough '[ ]'$ ; Wheezing'[ ]'$ ; Hemoptysis'[ ]'$ ; Sputum '[ ]'$ ; Snoring [ y]  GI: Vomiting'[ ]'$ ; Dysphagia'[ ]'$ ; Melena'[ ]'$ ; Hematochezia '[ ]'$ ; Heartburn'[ ]'$ ; Abdominal pain '[ ]'$ ; Constipation '[ ]'$ ; Diarrhea '[ ]'$ ; BRBPR '[ ]'$   GU: Hematuria'[ ]'$ ; Dysuria '[ ]'$ ; Nocturia'[ ]'$   Vascular: Pain in legs with walking '[ ]'$ ; Pain in feet with lying flat '[ ]'$ ; Non-healing sores '[ ]'$ ; Stroke '[ ]'$ ; TIA '[ ]'$ ; Slurred speech '[ ]'$ ;  Neuro: Headaches'[ ]'$ ; Vertigo'[ ]'$ ; Seizures'[ ]'$ ; Paresthesias'[ ]'$ ;Blurred vision '[ ]'$ ; Diplopia '[ ]'$ ; Vision changes '[ ]'$   Ortho/Skin: Arthritis '[ ]'$ ; Joint pain [Blue.Reese]; Muscle pain '[ ]'$ ; Joint swelling '[ ]'$ ; Back Pain '[ ]'$ ; Rash '[ ]'$   Psych: Depression'[ ]'$ ; Anxiety[y ]  Heme: Bleeding problems '[ ]'$ ; Clotting disorders '[ ]'$ ; Anemia '[ ]'$   Endocrine: Diabetes '[ ]'$ ; Thyroid dysfunction'[ ]'$    Past Medical History:   Diagnosis Date   Allergy    Anxiety    Cancer (HCC)    skin cancer   Neuromuscular disorder (HCC)    Raynaud's dx   TMJ (dislocation of temporomandibular joint)     Current Outpatient Medications  Medication Sig Dispense Refill   albuterol (VENTOLIN HFA) 108 (90 Base) MCG/ACT inhaler SMARTSIG:1 Puff(s) Via Inhaler Every 4 Hours PRN     B Complex-C (SUPER B COMPLEX PO) Take by mouth.     Baclofen 5 MG TABS Take 1 tablet by mouth daily as needed.     BOTOX 100 units SOLR injection      clonazePAM (KLONOPIN) 0.5 MG tablet Take 0.5 mg by mouth 2 (two) times daily.     cyanocobalamin (,VITAMIN B-12,) 1000 MCG/ML injection Inject into the muscle.     nicotine polacrilex (NICORETTE) 4 MG gum Take by mouth.     VITAMIN D PO Take by mouth.     mupirocin ointment (BACTROBAN) 2 % Apply topically.     Current Facility-Administered Medications  Medication Dose Route Frequency Provider Last Rate Last Admin   0.9 %  sodium chloride infusion  500 mL Intravenous Once GJackquline Denmark MD        Allergies  Allergen Reactions   Doxycycline Other (See Comments) and Shortness Of Breath   Amoxicillin-Pot Clavulanate Nausea And  Vomiting    Other reaction(s): Other (See Comments)   Phentermine-Topiramate Other (See Comments)    Other reaction(s): Other (See Comments) - headaches       Social History   Socioeconomic History   Marital status: Married    Spouse name: Not on file   Number of children: Not on file   Years of education: Not on file   Highest education level: Not on file  Occupational History   Not on file  Tobacco Use   Smoking status: Every Day    Packs/day: 0.50    Types: Cigarettes   Smokeless tobacco: Never   Tobacco comments:    0.5-1.0 a day   Vaping Use   Vaping Use: Never used  Substance and Sexual Activity   Alcohol use: Yes    Comment: OCC   Drug use: Never   Sexual activity: Not on file  Other Topics Concern   Not on file  Social History Narrative   Not on  file   Social Determinants of Health   Financial Resource Strain: Not on file  Food Insecurity: Not on file  Transportation Needs: Not on file  Physical Activity: Not on file  Stress: Not on file  Social Connections: Not on file  Intimate Partner Violence: Not on file      Family History  Problem Relation Age of Onset   Colon polyps Mother    Colon cancer Neg Hx    Esophageal cancer Neg Hx    Rectal cancer Neg Hx    Stomach cancer Neg Hx     Vitals:   12/18/21 0934  BP: (!) 150/92  Pulse: 88  SpO2: 98%  Weight: 98.8 kg (217 lb 12.8 oz)    PHYSICAL EXAM: General:  Well appearing. No respiratory difficulty HEENT: normal Neck: supple. no JVD. Carotids 2+ bilat; no bruits. No lymphadenopathy or thryomegaly appreciated. Cor: PMI nondisplaced. Regular rate & rhythm. No rubs, gallops or murmurs. Lungs: clear Abdomen: obese soft, nontender, nondistended. No hepatosplenomegaly. No bruits or masses. Good bowel sounds. Extremities: no cyanosis, clubbing, rash, edema Neuro: alert & oriented x 3, cranial nerves grossly intact. moves all 4 extremities w/o difficulty. Affect pleasant.  ECG: NSR 83 nonspecific ST&T wave abnormality Personally reviewed   ASSESSMENT & PLAN:  1. Right Breast Cancer - Diagnosed with right breast CA (triple positive) in 2/23 - s/p lumpectomy 4/23 margins clear. LNs negative - Adjuvant chemo taxotere/cisplat/Herceptin  07/10/21 - 10/28/21 - Single agent Herceptin on 11/18/21, 12/09/21 - Echo 06/23/21 EF 60-65% GLS -18.5 - Echo 09/14/21 EF 60-65% GLS -19.4 Trace TR - Echo 12/07/21 EF 60-65% GLS -15.1% Mild TR RVSP 61mHG - No evidence of cardiotoxicity - Continue Herceptin. Repeat echo 3 months  2. HTN - BP elevated. She is reluctant to start anti-HTN meds today - will track home BPs and continue to focus on diet and weight loss - she will bring me a list of BPs at next visit  3. Snoring - suspect underlying OSA - refuses sleep study currently.  Wants to try weight loss first - will re-address at next visit. Suspect contributing to HTN and TR/PH on echo  4. Obesity - discussed weight loss as above  5. CAD risk - extensive FHx of CAD - she has multiple RFs - will need eventual CAC scoring  6. Smoking - discussed need to quit   DGlori Bickers MD  11:27 AM    DGlori Bickers MD  10:12 AM

## 2021-12-18 NOTE — Patient Instructions (Signed)
Your physician recommends that you schedule a follow-up appointment in: 3 months with an echocardiogram  If you have any questions or concerns before your next appointment please send [place] a message through [place] or call our office at [phone].    TO LEAVE A MESSAGE FOR THE NURSE SELECT OPTION 2, PLEASE LEAVE A MESSAGE INCLUDING: YOUR NAME DATE OF BIRTH CALL BACK NUMBER REASON FOR CALL**this is important as we prioritize the call backs  YOU WILL RECEIVE A CALL BACK THE SAME DAY AS LONG AS YOU CALL BEFORE 4:00 PM

## 2022-01-27 ENCOUNTER — Other Ambulatory Visit (HOSPITAL_COMMUNITY): Payer: Self-pay

## 2022-01-27 DIAGNOSIS — C50911 Malignant neoplasm of unspecified site of right female breast: Secondary | ICD-10-CM

## 2022-01-28 ENCOUNTER — Telehealth (HOSPITAL_COMMUNITY): Payer: Self-pay | Admitting: *Deleted

## 2022-02-26 ENCOUNTER — Ambulatory Visit (HOSPITAL_COMMUNITY)
Admission: RE | Admit: 2022-02-26 | Discharge: 2022-02-26 | Disposition: A | Discharge status: Home / Self Care | Payer: BC Managed Care – PPO | Source: Ambulatory Visit | Attending: Internal Medicine | Admitting: Internal Medicine

## 2022-02-26 ENCOUNTER — Encounter (HOSPITAL_COMMUNITY): Payer: Self-pay | Admitting: Internal Medicine

## 2022-02-26 ENCOUNTER — Ambulatory Visit (HOSPITAL_BASED_OUTPATIENT_CLINIC_OR_DEPARTMENT_OTHER)
Admission: RE | Admit: 2022-02-26 | Discharge: 2022-02-26 | Disposition: A | Discharge status: Home / Self Care | Payer: BC Managed Care – PPO | Source: Ambulatory Visit | Attending: Internal Medicine | Admitting: Internal Medicine

## 2022-02-26 VITALS — BP 130/80 | HR 85 | Wt 195.8 lb

## 2022-02-26 DIAGNOSIS — Z720 Tobacco use: Secondary | ICD-10-CM

## 2022-02-26 DIAGNOSIS — I10 Essential (primary) hypertension: Secondary | ICD-10-CM | POA: Insufficient documentation

## 2022-02-26 DIAGNOSIS — C50911 Malignant neoplasm of unspecified site of right female breast: Secondary | ICD-10-CM | POA: Insufficient documentation

## 2022-02-26 DIAGNOSIS — Z0189 Encounter for other specified special examinations: Secondary | ICD-10-CM

## 2022-02-26 DIAGNOSIS — R0683 Snoring: Secondary | ICD-10-CM | POA: Diagnosis not present

## 2022-02-26 LAB — ECHOCARDIOGRAM COMPLETE
Area-P 1/2: 3.26 cm2
S' Lateral: 3.1 cm

## 2022-02-26 NOTE — Progress Notes (Signed)
CARDIO-ONCOLOGY CLINIC NOTE  Referring Physician: Dr. 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 Humphrey Rolls for enrollment into the Cardio-Oncology program.   Denies h/o cardiac problems. Had stress echo 20 years for CP. Which was normal. Feels ok. Fatigued from chemo. Not exercising. No current DOE. No CP. + snoring. Mild LE edema with chemo but now normal.   - Diagnosed with right breast CA (triple positive).  - s/p lumpectomy 4/23 margins clear. LNs negative - Adjuvant chemo taxotere/cisplat/Herceptin  07/10/21 - 10/28/21 - Single agent Herceptin on 11/18/21, 12/09/21  Echo 06/23/21 EF 60-65% GLS -18.5 Echo 09/14/21 EF 60-65% GLS -19.4 Trace TR Echo 12/07/21 EF 60-65% GLS -15.1% Mild TR 78mHG Echo 02/26/22 EF 65-70% GLS -16.8%   Here with her husband. Doing well. Has 5 Herceptin treatments left. Feels good. Exercising. Has lost 8 pounds. No CP or SOB. Trying to cut back on cigs. Smoking 1/2 ppd and using nicotine gum. SBP 110-120s   Echo today EF 65-70%    Past Medical History:  Diagnosis Date   Allergy    Anxiety    Cancer ([place]    skin cancer   [place]    Raynaud's dx   TMJ (dislocation of temporomandibular joint)     Current Outpatient Medications  Medication Sig Dispense Refill   albuterol (VENTOLIN HFA) 108 (90 Base) MCG/ACT inhaler SMARTSIG:1 Puff(s) Via Inhaler Every 4 Hours PRN     B Complex-C (SUPER B COMPLEX PO) Take by mouth.     Baclofen 5 MG TABS Take 1 tablet by mouth daily as needed.     BOTOX 100 units SOLR injection      clonazePAM (KLONOPIN) 0.5 MG tablet Take 0.5 mg by mouth 2 (two) times daily.     cyanocobalamin (,VITAMIN B-12,) 1000 MCG/ML injection Inject into the muscle.     nicotine polacrilex (NICORETTE) 4 MG gum Take by mouth.     VITAMIN D PO Take by mouth.     Current Facility-Administered Medications  Medication Dose Route Frequency  Provider Last Rate Last Admin   0.9 %  sodium chloride infusion  500 mL Intravenous Once GJackquline Denmark MD        Allergies  Allergen Reactions   Doxycycline Other (See Comments) and Shortness Of Breath   Amoxicillin-Pot Clavulanate Nausea And Vomiting    Other reaction(s): Other (See Comments)   Phentermine-Topiramate Other (See Comments)    Other reaction(s): Other (See Comments) - headaches       Social History   Socioeconomic History   Marital status: Married    Spouse name: Not on file   Number of children: Not on file   Years of education: Not on file   Highest education level: Not on file  Occupational History   Not on file  Tobacco Use   Smoking status: Every Day    Packs/day: 0.50    Types: Cigarettes   Smokeless tobacco: Never   Tobacco comments:    0.5-1.0 a day   Vaping Use   Vaping Use: Never used  Substance and Sexual Activity   Alcohol use: Yes    Comment: OCC   Drug use: Never   Sexual activity: Not on file  Other Topics Concern   Not on file  Social History Narrative   Not on file   Social Determinants of Health   Financial Resource Strain: Not on file  Food Insecurity: Not on file  Transportation Needs: Not on file  Physical Activity: Not on file  Stress: Not on file  Social Connections: Not on file  Intimate Partner Violence: Not on file      Family History  Problem Relation Age of Onset   Colon polyps Mother    Colon cancer Neg Hx    Esophageal cancer Neg Hx    Rectal cancer Neg Hx    Stomach cancer Neg Hx     Vitals:   02/26/22 1115  BP: 130/80  Pulse: 85  SpO2: 96%  Weight: 88.8 kg (195 lb 12.8 oz)    PHYSICAL EXAM: General:  Well appearing. No resp difficulty HEENT: normal Neck: supple. no JVD. Carotids 2+ bilat; no bruits. No lymphadenopathy or thryomegaly appreciated. Cor: PMI nondisplaced. Regular rate & rhythm. No rubs, gallops or murmurs. Lungs: clear Abdomen: soft, nontender, nondistended. No  hepatosplenomegaly. No bruits or masses. Good bowel sounds. Extremities: no cyanosis, clubbing, rash, edema Neuro: alert & orientedx3, cranial nerves grossly intact. moves all 4 extremities w/o difficulty. Affect pleasant    ASSESSMENT & PLAN:  1. Right Breast Cancer - Diagnosed with right breast CA (triple positive) in 2/23 - s/p lumpectomy 4/23 margins clear. LNs negative - Adjuvant chemo taxotere/cisplat/Herceptin  07/10/21 - 10/28/21 - Single agent Herceptin on 11/18/21, 12/09/21 - Echo 06/23/21 EF 60-65% GLS -18.5 - Echo 09/14/21 EF 60-65% GLS -19.4 Trace TR - Echo 12/07/21 EF 60-65% GLS -15.1% Mild TR RVSP 45mHG - Echo 02/26/22 EF 65-70% GLS -16.8%  - No evidence of cardiotoxicity - Continue Herceptin. Repeat echo 3-4 months after completion of Herceptin   2. HTN - Tracking BPs at home. Much improved with weight liss  3. Snoring - suspect underlying OSA - refuses sleep study currently. Wants to continue with weight loss first  4. Obesity - has lost 10 pounds. Congratulated her.  - Continue efforts  5. CAD risk - extensive FHx of CAD - she has multiple RFs - will need eventual CAC scoring  6. Smoking - working on quitting. - we discussed cessation  DGlori Bickers MD  12:01 PM

## 2022-02-26 NOTE — Patient Instructions (Signed)
There has been no changes to your medications.  Your physician has requested that you have an echocardiogram. Echocardiography is a painless test that uses sound waves to create images of your heart. It provides your doctor with information about the size and shape of your heart and how well your heart's chambers and valves are working. This procedure takes approximately one hour. There are no restrictions for this procedure. Please do NOT wear cologne, perfume, aftershave, or lotions (deodorant is allowed). Please arrive 15 minutes prior to your appointment time.  Your physician recommends that you schedule a follow-up appointment in: 4 months with an echocardiogram (April 2024) ** please call the office in February to arrange your follow up appointment **  If you have any questions or concerns before your next appointment please send [place] a message through [place] or call our office at [phone].    TO LEAVE A MESSAGE FOR THE NURSE SELECT OPTION 2, PLEASE LEAVE A MESSAGE INCLUDING: YOUR NAME DATE OF BIRTH CALL BACK NUMBER REASON FOR CALL**this is important as we prioritize the call backs  YOU WILL RECEIVE A CALL BACK THE SAME DAY AS LONG AS YOU CALL BEFORE 4:00 PM  At the [facility], you and your health needs are our priority. As part of our continuing mission to provide you with exceptional heart care, we have created designated Provider Care Teams. These Care Teams include your primary Cardiologist (physician) and Advanced Practice Providers (APPs- Physician Assistants and Nurse Practitioners) who all work together to provide you with the care you need, when you need it.   You may see any of the following providers on your designated Care Team at your next follow up: Dr Glori Bickers Dr Loralie Champagne Dr. Roxana Hires, NP Lyda Jester, [place] [facility] [place], [place] Forestine Na, NP Audry Riles, PharmD   Please be sure to bring in all  your medications bottles to every appointment.

## 2022-06-02 ENCOUNTER — Telehealth (HOSPITAL_COMMUNITY): Payer: Self-pay

## 2022-06-02 NOTE — Telephone Encounter (Signed)
Patient called to set up echo with follow up with Dr Margaret Woodward.  Can you call her? She wants to have it done before the 23rd.

## 2022-06-03 ENCOUNTER — Telehealth (HOSPITAL_COMMUNITY): Payer: Self-pay | Admitting: Vascular Surgery

## 2022-06-03 NOTE — Telephone Encounter (Signed)
Lvm giving pt echo ptions 4/11/ 4/12 @ 730, and work in w/ db 4/15, asked pt o call back to confirm

## 2022-06-07 ENCOUNTER — Ambulatory Visit (HOSPITAL_COMMUNITY)
Admission: RE | Admit: 2022-06-07 | Discharge: 2022-06-07 | Disposition: A | Discharge status: Home / Self Care | Payer: BC Managed Care – PPO | Source: Ambulatory Visit | Attending: Internal Medicine | Admitting: Internal Medicine

## 2022-06-07 DIAGNOSIS — I509 Heart failure, unspecified: Secondary | ICD-10-CM | POA: Diagnosis present

## 2022-06-07 DIAGNOSIS — Z0189 Encounter for other specified special examinations: Secondary | ICD-10-CM | POA: Diagnosis not present

## 2022-06-07 DIAGNOSIS — F17200 Nicotine dependence, unspecified, uncomplicated: Secondary | ICD-10-CM | POA: Diagnosis not present

## 2022-06-07 DIAGNOSIS — C50911 Malignant neoplasm of unspecified site of right female breast: Secondary | ICD-10-CM | POA: Diagnosis not present

## 2022-06-07 LAB — ECHOCARDIOGRAM COMPLETE
AR max vel: 2.29 cm2
AV Area VTI: 2.27 cm2
AV Area mean vel: 2.17 cm2
AV Mean grad: 4 mmHg
AV Peak grad: 8.8 mmHg
Ao pk vel: 1.48 m/s
Area-P 1/2: 4.12 cm2
Calc EF: 51.8 %
MV VTI: 3.25 cm2
S' Lateral: 3 cm
Single Plane A2C EF: 41.3 %
Single Plane A4C EF: 59.3 %

## 2022-06-07 NOTE — Progress Notes (Signed)
*  PRELIMINARY RESULTS* Echocardiogram 2D Echocardiogram has been performed.  Carolyne Fiscal 06/07/2022, 12:08 PM

## 2022-06-14 ENCOUNTER — Ambulatory Visit (HOSPITAL_COMMUNITY)
Admission: RE | Admit: 2022-06-14 | Discharge: 2022-06-14 | Disposition: A | Discharge status: Home / Self Care | Payer: BC Managed Care – PPO | Source: Ambulatory Visit | Attending: Internal Medicine | Admitting: Internal Medicine

## 2022-06-14 ENCOUNTER — Encounter (HOSPITAL_COMMUNITY): Payer: Self-pay | Admitting: Internal Medicine

## 2022-06-14 VITALS — BP 120/78 | HR 92 | Wt 197.2 lb

## 2022-06-14 DIAGNOSIS — Z720 Tobacco use: Secondary | ICD-10-CM | POA: Diagnosis not present

## 2022-06-14 DIAGNOSIS — F17210 Nicotine dependence, cigarettes, uncomplicated: Secondary | ICD-10-CM | POA: Diagnosis not present

## 2022-06-14 DIAGNOSIS — Z853 Personal history of malignant neoplasm of breast: Secondary | ICD-10-CM | POA: Diagnosis not present

## 2022-06-14 DIAGNOSIS — N951 Menopausal and female climacteric states: Secondary | ICD-10-CM | POA: Insufficient documentation

## 2022-06-14 DIAGNOSIS — I2510 Atherosclerotic heart disease of native coronary artery without angina pectoris: Secondary | ICD-10-CM

## 2022-06-14 DIAGNOSIS — E669 Obesity, unspecified: Secondary | ICD-10-CM | POA: Diagnosis not present

## 2022-06-14 DIAGNOSIS — Z8249 Family history of ischemic heart disease and other diseases of the circulatory system: Secondary | ICD-10-CM | POA: Insufficient documentation

## 2022-06-14 DIAGNOSIS — C50911 Malignant neoplasm of unspecified site of right female breast: Secondary | ICD-10-CM | POA: Insufficient documentation

## 2022-06-14 DIAGNOSIS — R232 Flushing: Secondary | ICD-10-CM | POA: Insufficient documentation

## 2022-06-14 DIAGNOSIS — R0683 Snoring: Secondary | ICD-10-CM | POA: Diagnosis not present

## 2022-06-14 DIAGNOSIS — I10 Essential (primary) hypertension: Secondary | ICD-10-CM | POA: Diagnosis not present

## 2022-06-14 NOTE — Progress Notes (Signed)
CARDIO-ONCOLOGY CLINIC NOTE  Referring Physician: Dr. 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 Welton Flakes for enrollment into the Cardio-Oncology program.  Denies h/o cardiac problems. Had stress echo 20 years for CP. Which was normal. Feels ok. Fatigued from chemo. Not exercising. No current DOE. No CP. + snoring. Mild LE edema with chemo but now normal.   - Diagnosed with right breast CA (triple positive).  - s/p lumpectomy 4/23 margins clear. LNs negative - Adjuvant chemo taxotere/cisplat/Herceptin  07/10/21 - 10/28/21 - Single agent Herceptin on 11/18/21, 12/09/21  Echo 06/23/21 EF 60-65% GLS -18.5 Echo 09/14/21 EF 60-65% GLS -19.4 Trace TR Echo 12/07/21 EF 60-65% GLS -15.1% Mild TR Echo 02/26/22 EF 65-70% GLS -16.8%   Echo 06/07/22 EF 60-65% Personally reviewed  Here with her husband. Doing well. Has 2 Herceptin treatments left. Still smoking 10 cigs/day. No CP or SOB. Having hot flashes and BP goes to 150-160 at that time. Otherwise 120-130s    Past Medical History:  Diagnosis Date   Allergy    Anxiety    Cancer    skin cancer   Neuromuscular disorder    Raynaud's dx   TMJ (dislocation of temporomandibular joint)     Current Outpatient Medications  Medication Sig Dispense Refill   albuterol (VENTOLIN HFA) 108 (90 Base) MCG/ACT inhaler SMARTSIG:1 Puff(s) Via Inhaler Every 4 Hours PRN     anastrozole (ARIMIDEX) 1 MG tablet Take 1 mg by mouth daily.     B Complex-C (SUPER B COMPLEX PO) Take by mouth.     Baclofen 5 MG TABS Take 1 tablet by mouth daily as needed.     BOTOX 100 units SOLR injection      clonazePAM (KLONOPIN) 1 MG tablet Take 1 mg by mouth as needed for anxiety.     cyanocobalamin (,VITAMIN B-12,) 1000 MCG/ML injection Inject into the muscle.     nicotine polacrilex (NICORETTE) 4 MG gum Take by mouth.     VITAMIN D PO Take by mouth.     Current Facility-Administered  Medications  Medication Dose Route Frequency Provider Last Rate Last Admin   0.9 %  sodium chloride infusion  500 mL Intravenous Once Lynann Bologna, MD        Allergies  Allergen Reactions   Doxycycline Other (See Comments) and Shortness Of Breath   Amoxicillin-Pot Clavulanate Nausea And Vomiting    Other reaction(s): Other (See Comments)   Phentermine-Topiramate Other (See Comments)    Other reaction(s): Other (See Comments) - headaches       Social History   Socioeconomic History   Marital status: Married    Spouse name: Not on file   Number of children: Not on file   Years of education: Not on file   Highest education level: Not on file  Occupational History   Not on file  Tobacco Use   Smoking status: Every Day    Packs/day: .5    Types: Cigarettes   Smokeless tobacco: Never   Tobacco comments:    0.5-1.0 a day   Vaping Use   Vaping Use: Never used  Substance and Sexual Activity   Alcohol use: Yes    Comment: OCC   Drug use: Never   Sexual activity: Not on file  Other Topics Concern   Not on file  Social History Narrative   ** Merged History Encounter **  Social Determinants of Health   Financial Resource Strain: Not on file  Food Insecurity: Not on file  Transportation Needs: Not on file  Physical Activity: Not on file  Stress: Not on file  Social Connections: Not on file  Intimate Partner Violence: Not on file      Family History  Problem Relation Age of Onset   Colon polyps Mother    Colon cancer Neg Hx    Esophageal cancer Neg Hx    Rectal cancer Neg Hx    Stomach cancer Neg Hx     Vitals:   06/14/22 1525  BP: 120/78  Pulse: 92  SpO2: 100%  Weight: 89.4 kg (197 lb 3.2 oz)    Wt Readings from Last 3 Encounters:  06/14/22 89.4 kg (197 lb 3.2 oz)  02/26/22 88.8 kg (195 lb 12.8 oz)  12/18/21 91.2 kg (201 lb)    PHYSICAL EXAM: General:  Sitting in chair No resp difficulty HEENT: normal Neck: supple. no JVD. Carotids 2+ bilat;  no bruits. No lymphadenopathy or thryomegaly appreciated. Cor: PMI nondisplaced. Regular rate & rhythm. No rubs, gallops or murmurs. Lungs: clear Abdomen: obese soft, nontender, nondistended. No hepatosplenomegaly. No bruits or masses. Good bowel sounds. Extremities: no cyanosis, clubbing, rash, edema Neuro: alert & orientedx3, cranial nerves grossly intact. moves all 4 extremities w/o difficulty. Affect pleasant   ASSESSMENT & PLAN:  1. Right Breast Cancer - Diagnosed with right breast CA (triple positive) in 2/23 - s/p lumpectomy 4/23 margins clear. LNs negative - Adjuvant chemo taxotere/cisplat/Herceptin  07/10/21 - 10/28/21 - Single agent Herceptin on 11/18/21, 12/09/21 - Echo 06/23/21 EF 60-65% GLS -18.5 - Echo 09/14/21 EF 60-65% GLS -19.4 Trace TR - Echo 12/07/21 EF 60-65% GLS -15.1% Mild TR RVSP - Echo 02/26/22 EF 65-70% GLS -16.8%  - Echo 06/07/22 EF 60-65% - Has 2 rounds of herceptin left. No evidence of cardiotoxicity - F/u PRN  2. HTN - Labile in setting of hot flashes. Well controlled today.  - Will ned to follow - Consider losartan  3. Snoring - suspect underlying OSA - refuses sleep study currently. Wants to continue with weight loss first  4. Obesity - Continue weight loss efforts  5. CAD risk - extensive FHx of CAD - she has multiple RFs - will order CAC scoring  6. Smoking - working on quitting. Down to 1/2 ppd - we discussed need for cessation  Arvilla Meres, MD  3:47 PM

## 2022-06-14 NOTE — Patient Instructions (Signed)
There has been no changes.  You can call the Radiology department at [phone] to get your Calcium score scan arranged at a time that works for you.  Your physician recommends that you schedule a follow-up appointment in: as needed.  If you have any questions or concerns before your next appointment please send [place] a message through [facility] or call our office at [phone].    TO LEAVE A MESSAGE FOR THE NURSE SELECT OPTION 2, PLEASE LEAVE A MESSAGE INCLUDING: YOUR NAME DATE OF BIRTH CALL BACK NUMBER REASON FOR CALL**this is important as we prioritize the call backs  YOU WILL RECEIVE A CALL BACK THE SAME DAY AS LONG AS YOU CALL BEFORE 4:00 PM  At the [facility], you and your health needs are our priority. As part of our continuing mission to provide you with exceptional heart care, we have created designated Provider Care Teams. These Care Teams include your primary Cardiologist (physician) and Advanced Practice Providers (APPs- Physician Assistants and Nurse Practitioners) who all work together to provide you with the care you need, when you need it.   You may see any of the following providers on your designated Care Team at your next follow up: Dr Arvilla Meres Dr Marca Ancona Dr. Marcos Eke, NP Robbie Lis, [place] [facility] [place], [place] Brynda Peon, NP Karle Plumber, PharmD   Please be sure to bring in all your medications bottles to every appointment.    Thank you for choosing  [facility]

## 2022-10-13 ENCOUNTER — Ambulatory Visit (HOSPITAL_COMMUNITY)
Admission: RE | Admit: 2022-10-13 | Discharge: 2022-10-13 | Disposition: A | Discharge status: Home / Self Care | Payer: BC Managed Care – PPO | Source: Ambulatory Visit | Attending: Internal Medicine | Admitting: Internal Medicine

## 2022-10-13 DIAGNOSIS — C50911 Malignant neoplasm of unspecified site of right female breast: Secondary | ICD-10-CM | POA: Insufficient documentation

## 2023-11-07 ENCOUNTER — Ambulatory Visit: Payer: Self-pay | Admitting: Podiatry

## 2023-11-07 ENCOUNTER — Encounter: Payer: Self-pay | Admitting: Podiatry

## 2023-11-07 DIAGNOSIS — L600 Ingrowing nail: Secondary | ICD-10-CM

## 2023-11-07 MED ORDER — NEOMYCIN-POLYMYXIN-HC 3.5-10000-1 OT SUSP
OTIC | 0 refills | Status: AC
Start: 2023-11-07 — End: ?

## 2023-11-07 NOTE — Progress Notes (Signed)
 Subjective:  Patient ID: Margaret Woodward, female    DOB: 01/21/1971,  MRN: [id number]  Margaret Woodward presents to clinic today for:  Chief Complaint  Patient presents with   Ingrown Toenail    Right first, medial nail border is painful. Both borders are ingrown but the medial is worse. States she had a fungus a while back and they cut some nail out. Nail is curved in. Seems very sensitive. Is ok with procedure.  Not diabetic no anti coag.   Patient has been dealing with painful ingrown to the right first toe medial border. It's been going on for some time at this point.  She does deal with these recurrently.  This area is the most painful that it is been for her previously.  She thinks that it may have come from prior bout of onychomycosis which did resolve with medication.  PCP is Zachary Lamar BRAVO, NP.  Allergies  Allergen Reactions   Doxycycline Other (See Comments) and Shortness Of Breath   Amoxicillin-Pot Clavulanate Nausea And Vomiting    Other reaction(s): Other (See Comments)   Phentermine-Topiramate Er Other (See Comments)    Other reaction(s): Other (See Comments) - headaches     Review of Systems: Negative except as noted in the HPI.  Objective:  There were no vitals filed for this visit.  Margaret Woodward is a pleasant 53 y.o. female in NAD. AAO x 3.  Vascular Examination: Capillary refill time is less than 3 seconds to toes bilateral. Palpable pedal pulses b/l LE. Digital hair present b/l. No pedal edema b/l. Skin temperature gradient WNL b/l. No varicosities b/l. No cyanosis or clubbing noted b/l.   Dermatological Examination: There is incurvation of the right hallux medial nail border.  There is pain on palpation of the affected nail border.  No erythema or edema.  No drainage.  No signs of acute bacterial infection  Neurological Examination: Protective sensation intact with Semmes-Weinstein 10 gram monofilament b/l LE. Vibratory sensation intact b/l LE.      No data  to display           Assessment/Plan: 1. Ingrown toenail of right foot     Meds ordered this encounter  Medications   neomycin -polymyxin-hydrocortisone (CORTISPORIN) 3.5-10000-1 OTIC suspension    Sig: Apply 1-2 drops daily after soaking and cover with bandaid    Dispense:  10 mL    Refill:  0   # Ingrown toenail right first toe medial border Discussed patient's condition today.  After obtaining patient consent, the right hallux was anesthetized with a 50:50 mixture of 1% lidocaine plain and 0.5% bupivacaine plain for a total of 3cc's administered.  Toe was prepped with Betadine.  Upon confirmation of anesthesia, a freer elevator was utilized to free the right hallux medial nail border from the nail bed.  The nail border was then avulsed proximal to the eponychium and removed in toto.  The area was inspected for any remaining spicules.  A chemical matrixectomy was performed with phenol and neutralized with isopropyl alcohol solution.  Antibiotic ointment and a DSD were applied, followed by a Coban dressing.  Patient tolerated the anesthetic and procedure well and will f/u in 2-3 weeks for recheck.  Patient given post-procedure instructions for daily 20-minute Epsom salt soaks, antibiotic drops and daily use of Bandaids until toe starts to dry / form eschar.   Cortisporin drops prescribed to patient's pharmacy.  Apply 1 to 2 drops to the site following nail soak.  Recommend over-the-counter Tylenol and ibuprofen as needed for pain.   Return in about 2 weeks (around 11/21/2023) for Nail Check.   Ethan Saddler, DPM, AACFAS [facility]     [address] [place], [place] [address]                Office [phone]  Fax [phone]

## 2023-11-07 NOTE — Patient Instructions (Signed)

## 2023-11-21 ENCOUNTER — Encounter: Payer: Self-pay | Admitting: Podiatry

## 2023-11-21 ENCOUNTER — Ambulatory Visit (INDEPENDENT_AMBULATORY_CARE_PROVIDER_SITE_OTHER): Admitting: Podiatry

## 2023-11-21 DIAGNOSIS — L600 Ingrowing nail: Secondary | ICD-10-CM | POA: Diagnosis not present

## 2023-11-21 NOTE — Progress Notes (Signed)
       Subjective:  Patient ID: Margaret Woodward, female    DOB: 09-Feb-1971,  MRN: [id number]  Chief Complaint  Patient presents with   Ingrown Recheck    Right 1st nail. Lower medial side has some redness and clear drainage, no noticeable swelling. She is continuing the epsom soaks as well as antibiotic drops.     Margaret Woodward presents to clinic today for f/u of PNA to the right hallux medial border.  She is doing well.  Denies pain.  Has not noticed significant drainage.  PCP is Margaret Lamar BRAVO, Margaret Woodward.  Allergies  Allergen Reactions   Doxycycline Other (See Comments) and Shortness Of Breath   Amoxicillin-Pot Clavulanate Nausea And Vomiting    Other reaction(s): Other (See Comments)   Phentermine-Topiramate Er Other (See Comments)    Other reaction(s): Other (See Comments) - headaches     Objective:  There were no vitals filed for this visit.  Vascular Examination: Capillary refill time is less than 3 seconds to toes bilateral. Palpable pedal pulses b/l LE. Digital hair present b/l. No pedal edema b/l. Skin temperature gradient WNL b/l. No varicosities b/l. No cyanosis or clubbing noted b/l.   Dermatological Examination: Upon inspection of the PNA site, there are no clinical signs of infection.  No purulence, no necrosis, no malodor present.  Minimal to no erythema present.  Minimal to no pain on palpation of area.  Scab not fully formed yet.  There is some mild yellow discoloration of the nails.  Assessment/Plan: 1. Ingrown toenail of right foot     No orders of the defined types were placed in this encounter.  PNA site healing well.  Can continue the Epsom salt soaks and antibiotic drops until it scabs formed.  Can leave toe open to air especially at home.  She is asking about home treatments for the yellow discoloration she is noticing her nails.  Did discuss that this could be early sign of some fungal infection.  Recommend starting to apply tea tree oil mixed with coconut oil  to the toenails nightly.  Return if symptoms worsen or fail to improve.   Zadie Deemer L. Lamount MAUL, AACFAS [facility]     [address] [place], [place] [address]                Office [phone]  Fax [phone]

## 2023-11-21 NOTE — Patient Instructions (Signed)
 You can combine 3 tablespoons of coconut oil and 10-15 drops of tea tree oil together and apply to the toenails once a day.
